# Patient Record
Sex: Female | Born: 1975 | Race: White | Hispanic: No | Marital: Married | Smoking: Never smoker
Health system: Southern US, Community
[De-identification: ages and names within clinical notes are randomized; demographics above are authoritative.]

## PROBLEM LIST (undated history)

## (undated) DIAGNOSIS — Z87898 Personal history of other specified conditions: Secondary | ICD-10-CM

## (undated) DIAGNOSIS — Z8614 Personal history of Methicillin resistant Staphylococcus aureus infection: Secondary | ICD-10-CM

## (undated) DIAGNOSIS — G4730 Sleep apnea, unspecified: Secondary | ICD-10-CM

## (undated) DIAGNOSIS — D759 Disease of blood and blood-forming organs, unspecified: Secondary | ICD-10-CM

## (undated) DIAGNOSIS — Z87820 Personal history of traumatic brain injury: Secondary | ICD-10-CM

## (undated) DIAGNOSIS — E669 Obesity, unspecified: Secondary | ICD-10-CM

## (undated) DIAGNOSIS — E162 Hypoglycemia, unspecified: Secondary | ICD-10-CM

## (undated) DIAGNOSIS — M1990 Unspecified osteoarthritis, unspecified site: Secondary | ICD-10-CM

## (undated) DIAGNOSIS — S92301K Fracture of unspecified metatarsal bone(s), right foot, subsequent encounter for fracture with nonunion: Secondary | ICD-10-CM

## (undated) DIAGNOSIS — H8109 Meniere's disease, unspecified ear: Secondary | ICD-10-CM

## (undated) DIAGNOSIS — K219 Gastro-esophageal reflux disease without esophagitis: Secondary | ICD-10-CM

## (undated) HISTORY — PX: WISDOM TOOTH EXTRACTION: SHX21

---

## 2006-06-06 DIAGNOSIS — Z8614 Personal history of Methicillin resistant Staphylococcus aureus infection: Secondary | ICD-10-CM

## 2006-06-06 HISTORY — DX: Personal history of Methicillin resistant Staphylococcus aureus infection: Z86.14

## 2010-08-05 DIAGNOSIS — IMO0002 Reserved for concepts with insufficient information to code with codable children: Secondary | ICD-10-CM | POA: Insufficient documentation

## 2010-08-05 DIAGNOSIS — R87613 High grade squamous intraepithelial lesion on cytologic smear of cervix (HGSIL): Secondary | ICD-10-CM | POA: Insufficient documentation

## 2010-10-18 ENCOUNTER — Encounter (INDEPENDENT_AMBULATORY_CARE_PROVIDER_SITE_OTHER): Payer: Self-pay | Admitting: Obstetrics & Gynecology

## 2010-10-18 ENCOUNTER — Other Ambulatory Visit: Payer: Self-pay | Admitting: Obstetrics & Gynecology

## 2010-10-18 ENCOUNTER — Other Ambulatory Visit (HOSPITAL_COMMUNITY)
Admission: RE | Admit: 2010-10-18 | Discharge: 2010-10-18 | Disposition: A | Discharge status: Home / Self Care | Payer: Self-pay | Source: Ambulatory Visit | Attending: Obstetrics & Gynecology | Admitting: Obstetrics & Gynecology

## 2010-10-18 DIAGNOSIS — N870 Mild cervical dysplasia: Secondary | ICD-10-CM | POA: Insufficient documentation

## 2010-10-18 DIAGNOSIS — R87613 High grade squamous intraepithelial lesion on cytologic smear of cervix (HGSIL): Secondary | ICD-10-CM

## 2010-10-18 DIAGNOSIS — N9489 Other specified conditions associated with female genital organs and menstrual cycle: Secondary | ICD-10-CM

## 2010-10-20 ENCOUNTER — Ambulatory Visit (HOSPITAL_COMMUNITY)
Admission: RE | Admit: 2010-10-20 | Discharge: 2010-10-20 | Disposition: A | Discharge status: Home / Self Care | Payer: Self-pay | Source: Ambulatory Visit | Attending: Obstetrics & Gynecology | Admitting: Obstetrics & Gynecology

## 2010-10-20 ENCOUNTER — Other Ambulatory Visit: Payer: Self-pay | Admitting: Obstetrics & Gynecology

## 2010-10-20 ENCOUNTER — Ambulatory Visit (HOSPITAL_COMMUNITY): Admission: RE | Admit: 2010-10-20 | Payer: Self-pay | Source: Ambulatory Visit

## 2010-10-20 DIAGNOSIS — N9489 Other specified conditions associated with female genital organs and menstrual cycle: Secondary | ICD-10-CM

## 2010-10-20 DIAGNOSIS — IMO0002 Reserved for concepts with insufficient information to code with codable children: Secondary | ICD-10-CM | POA: Insufficient documentation

## 2010-11-05 ENCOUNTER — Ambulatory Visit (INDEPENDENT_AMBULATORY_CARE_PROVIDER_SITE_OTHER): Payer: Self-pay | Admitting: Obstetrics & Gynecology

## 2010-11-05 DIAGNOSIS — R87613 High grade squamous intraepithelial lesion on cytologic smear of cervix (HGSIL): Secondary | ICD-10-CM

## 2010-11-15 NOTE — H&P (Signed)
  NAME:  Priscilla Jacobs, Priscilla Jacobs                ACCOUNT NO.:  [id number]  MEDICAL RECORD NO.:  [id number]           PATIENT TYPE:  A  LOCATION:  WOC                          FACILITY:  [facility]  PHYSICIAN:  Allie Bossier, MD        DATE OF BIRTH:  06/27/1975  DATE OF ADMISSION:  11/05/2010 DATE OF DISCHARGE:                             HISTORY & PHYSICAL   Ms. Priscilla Jacobs is a 35 year old married white G5, P4, A1 with 4 living children.  She was referred here for an abnormal Pap smear that showed high-grade dysplasia.  She gives a distant history of abnormal Pap smears and she had only what sounds like a colposcopy.  She is not a smoker.  Her colposcopy showed findings consistent with high-grade dysplasia.  Two random cervical biopsy showed low-grade dysplasia, however, an endocervical curettage showed a piece of detached high-grade dysplasia.  I have recommended a cone biopsy for this.  PAST MEDICAL HISTORY:  Significant only for obesity, high-grade dysplasia as mentioned above.  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  She has had no seizure since then and she tells me her neurologist told her the seizure was only due to her sugars and she has never been treated for seizures.  REVIEW OF SYSTEMS:  Her husband is status post vasectomy.  She has dyspareunia associated with orgasms for the last 2 years and an ultrasound that was done recently was normal.  She has been having hot flashes for the last year.  PAST SURGICAL HISTORY:  Wisdom tooth extraction.  FAMILY HISTORY:  Negative for breast, GYN, and colon malignancies.  SOCIAL HISTORY:  Negative for tobacco, alcohol, or drug use.  No latex allergies.  No drug allergies.  MEDICATIONS:  None.  PHYSICAL EXAMINATION:  GENERAL:  Well-nourished, well-hydrated pleasant white female. VITAL SIGNS:  Her height is 5 feet 0 inches, her weight is 200 pounds. Her blood pressure is 128/91, pulse is  normal. HEENT:  Normal. HEART:  Regular rate and rhythm. LUNGS:  Clear to auscultation bilaterally. ABDOMEN:  Benign, obese.  Colposcopy exam as above.  ASSESSMENT AND PLAN:  Positive endocervical curettage with high-grade dysplasia on Pap.  We will proceed with a cone biopsy.  I have explained risks of surgery as well as benefits and this will be scheduled at the earliest convenient time.     Allie Bossier, MD     MCD/MEDQ  D:  11/05/2010  T:  11/05/2010  Job:  [id number]  Electronically Signed by Nicholaus Bloom MD on 11/15/2010 09:47:31 AM

## 2010-12-03 ENCOUNTER — Encounter (HOSPITAL_COMMUNITY): Payer: Self-pay | Admitting: *Deleted

## 2010-12-29 ENCOUNTER — Encounter (HOSPITAL_COMMUNITY)
Admission: RE | Disposition: A | Discharge status: Home / Self Care | Payer: Self-pay | Source: Ambulatory Visit | Attending: Obstetrics & Gynecology

## 2010-12-29 ENCOUNTER — Ambulatory Visit (HOSPITAL_COMMUNITY): Payer: Self-pay | Admitting: Anesthesiology

## 2010-12-29 ENCOUNTER — Other Ambulatory Visit: Payer: Self-pay | Admitting: Obstetrics & Gynecology

## 2010-12-29 ENCOUNTER — Ambulatory Visit (HOSPITAL_COMMUNITY)
Admission: RE | Admit: 2010-12-29 | Discharge: 2010-12-29 | Disposition: A | Discharge status: Home / Self Care | Payer: Self-pay | Source: Ambulatory Visit | Attending: Obstetrics & Gynecology | Admitting: Obstetrics & Gynecology

## 2010-12-29 ENCOUNTER — Encounter (HOSPITAL_COMMUNITY): Payer: Self-pay | Admitting: Obstetrics & Gynecology

## 2010-12-29 ENCOUNTER — Encounter (HOSPITAL_COMMUNITY): Payer: Self-pay | Admitting: Anesthesiology

## 2010-12-29 DIAGNOSIS — R87613 High grade squamous intraepithelial lesion on cytologic smear of cervix (HGSIL): Secondary | ICD-10-CM

## 2010-12-29 HISTORY — DX: Obesity, unspecified: E66.9

## 2010-12-29 HISTORY — PX: CERVICAL CONIZATION W/BX: SHX1330

## 2010-12-29 LAB — CBC
HCT: 41.4 % (ref 36.0–46.0)
Hemoglobin: 13.9 g/dL (ref 12.0–15.0)
MCHC: 33.6 g/dL (ref 30.0–36.0)

## 2010-12-29 LAB — BASIC METABOLIC PANEL
CO2: 23 mEq/L (ref 19–32)
Calcium: 9.1 mg/dL (ref 8.4–10.5)
Glucose, Bld: 87 mg/dL (ref 70–99)
Potassium: 3.9 mEq/L (ref 3.5–5.1)
Sodium: 136 mEq/L (ref 135–145)

## 2010-12-29 SURGERY — CONE BIOPSY, CERVIX
Anesthesia: General

## 2010-12-29 MED ORDER — LIDOCAINE HCL (CARDIAC) 20 MG/ML IV SOLN
INTRAVENOUS | Status: AC
  Filled 2010-12-29: qty 5

## 2010-12-29 MED ORDER — LIDOCAINE HCL (CARDIAC) 20 MG/ML IV SOLN
INTRAVENOUS | Status: DC | PRN
  Administered 2010-12-29: 80 mg via INTRAVENOUS

## 2010-12-29 MED ORDER — ONDANSETRON HCL 4 MG/2ML IJ SOLN
INTRAMUSCULAR | Status: AC
  Filled 2010-12-29: qty 2

## 2010-12-29 MED ORDER — PANTOPRAZOLE SODIUM 40 MG PO TBEC: 40.0000 mg | DELAYED_RELEASE_TABLET | Freq: Once | ORAL | Status: DC | PRN

## 2010-12-29 MED ORDER — IBUPROFEN 600 MG PO TABS: 600.0000 mg | ORAL_TABLET | Freq: Four times a day (QID) | ORAL | Status: AC | PRN

## 2010-12-29 MED ORDER — LACTATED RINGERS IV SOLN
INTRAVENOUS | Status: DC
  Administered 2010-12-29 (×2): via INTRAVENOUS

## 2010-12-29 MED ORDER — KETOROLAC TROMETHAMINE 30 MG/ML IJ SOLN: 15.0000 mg | Freq: Once | INTRAMUSCULAR | Status: DC | PRN

## 2010-12-29 MED ORDER — SCOPOLAMINE 1 MG/3DAYS TD PT72: 1.0000 | MEDICATED_PATCH | Freq: Once | TRANSDERMAL | Status: DC | PRN

## 2010-12-29 MED ORDER — ONDANSETRON HCL 4 MG/2ML IJ SOLN
INTRAMUSCULAR | Status: DC | PRN
  Administered 2010-12-29: 4 mg via INTRAVENOUS

## 2010-12-29 MED ORDER — PROPOFOL 10 MG/ML IV EMUL
INTRAVENOUS | Status: DC | PRN
  Administered 2010-12-29: 200 mg via INTRAVENOUS

## 2010-12-29 MED ORDER — CITRIC ACID-SODIUM CITRATE 334-500 MG/5ML PO SOLN: 30.0000 mL | Freq: Once | ORAL | Status: DC | PRN

## 2010-12-29 MED ORDER — BUPIVACAINE HCL (PF) 0.5 % IJ SOLN
INTRAMUSCULAR | Status: DC | PRN
  Administered 2010-12-29: 10 mL

## 2010-12-29 MED ORDER — MIDAZOLAM HCL 2 MG/2ML IJ SOLN
INTRAMUSCULAR | Status: AC
  Filled 2010-12-29: qty 2

## 2010-12-29 MED ORDER — FENTANYL CITRATE 0.05 MG/ML IJ SOLN: 25.0000 ug | INTRAMUSCULAR | Status: DC | PRN

## 2010-12-29 MED ORDER — LUGOLS 5 % PO SOLN
ORAL | Status: DC | PRN
  Administered 2010-12-29: 0.1 mL

## 2010-12-29 MED ORDER — KETOROLAC TROMETHAMINE 30 MG/ML IJ SOLN
INTRAMUSCULAR | Status: AC
  Filled 2010-12-29: qty 1

## 2010-12-29 MED ORDER — DEXAMETHASONE SODIUM PHOSPHATE 10 MG/ML IJ SOLN
INTRAMUSCULAR | Status: AC
  Filled 2010-12-29: qty 1

## 2010-12-29 MED ORDER — FENTANYL CITRATE 0.05 MG/ML IJ SOLN
INTRAMUSCULAR | Status: AC
  Filled 2010-12-29: qty 2

## 2010-12-29 MED ORDER — FENTANYL CITRATE 0.05 MG/ML IJ SOLN
INTRAMUSCULAR | Status: DC | PRN
  Administered 2010-12-29 (×2): 50 ug via INTRAVENOUS

## 2010-12-29 MED ORDER — PROPOFOL 10 MG/ML IV EMUL
INTRAVENOUS | Status: AC
  Filled 2010-12-29: qty 20

## 2010-12-29 MED ORDER — DEXAMETHASONE SODIUM PHOSPHATE 4 MG/ML IJ SOLN
INTRAMUSCULAR | Status: DC | PRN
  Administered 2010-12-29: 4 mg via INTRAVENOUS

## 2010-12-29 MED ORDER — OXYCODONE-ACETAMINOPHEN 2.5-325 MG PO TABS: 1.0000 | ORAL_TABLET | ORAL | Status: AC | PRN

## 2010-12-29 MED ORDER — FAMOTIDINE 20 MG PO TABS: 20.0000 mg | ORAL_TABLET | Freq: Once | ORAL | Status: DC | PRN

## 2010-12-29 MED ORDER — MIDAZOLAM HCL 5 MG/5ML IJ SOLN
INTRAMUSCULAR | Status: DC | PRN
  Administered 2010-12-29: 2 mg via INTRAVENOUS

## 2010-12-29 SURGICAL SUPPLY — 27 items
BLADE SURG 11 STRL SS (BLADE) ×2 IMPLANT
CLOTH BEACON ORANGE TIMEOUT ST (SAFETY) ×2 IMPLANT
CONTAINER PREFILL 10% NBF 60ML (FORM) ×4 IMPLANT
COUNTER NEEDLE 1200 MAGNETIC (NEEDLE) ×2 IMPLANT
DILATOR CANAL MILEX (MISCELLANEOUS) ×1 IMPLANT
DRAPE UTILITY XL STRL (DRAPES) ×1 IMPLANT
ELECT REM PT RETURN 9FT ADLT (ELECTROSURGICAL) ×2
ELECTRODE REM PT RTRN 9FT ADLT (ELECTROSURGICAL) ×1 IMPLANT
GLOVE BIO SURGEON STRL SZ 6.5 (GLOVE) ×4 IMPLANT
GLOVE BIOGEL PI IND STRL 6 (GLOVE) ×1 IMPLANT
GLOVE BIOGEL PI INDICATOR 6 (GLOVE) ×1
GOWN PREVENTION PLUS LG XLONG (DISPOSABLE) ×4 IMPLANT
NDL SPNL 18GX3.5 QUINCKE PK (NEEDLE) ×1 IMPLANT
NEEDLE SPNL 18GX3.5 QUINCKE PK (NEEDLE) ×2 IMPLANT
PACK VAGINAL MINOR WOMEN LF (CUSTOM PROCEDURE TRAY) ×2 IMPLANT
PENCIL BUTTON HOLSTER BLD 10FT (ELECTRODE) ×2 IMPLANT
SCOPETTES 8  STERILE (MISCELLANEOUS) ×2
SCOPETTES 8 STERILE (MISCELLANEOUS) ×1 IMPLANT
SUT CHROMIC 0 CT 1 (SUTURE) IMPLANT
SUT VIC AB 0 CT1 27 (SUTURE) ×2
SUT VIC AB 0 CT1 27XBRD ANBCTR (SUTURE) IMPLANT
SUT VICRYL 0 UR6 27IN ABS (SUTURE) ×3 IMPLANT
SYR CONTROL 10ML LL (SYRINGE) ×2 IMPLANT
TOWEL OR 17X24 6PK STRL BLUE (TOWEL DISPOSABLE) ×4 IMPLANT
TUBING NON-CON 1/4 X 20 CONN (TUBING) ×2 IMPLANT
WATER STERILE IRR 1000ML POUR (IV SOLUTION) ×2 IMPLANT
YANKAUER SUCT BULB TIP NO VENT (SUCTIONS) ×2 IMPLANT

## 2010-12-29 NOTE — Transfer of Care (Signed)
Immediate Anesthesia Transfer of Care Note  Patient: Priscilla Jacobs  Procedure(s) Performed:  CONIZATION CERVIX WITH BIOPSY  Patient Location: PACU  Anesthesia Type: General  Level of Consciousness: sedated  Airway & Oxygen Therapy: Patient Spontanous Breathing and Patient connected to nasal cannula oxygen  Post-op Assessment: Report given to PACU RN and Post -op Vital signs reviewed and stable  Post vital signs: Reviewed and stable  Complications: No apparent anesthesia complications

## 2010-12-29 NOTE — Anesthesia Postprocedure Evaluation (Signed)
  Anesthesia Post-op Note  Patient: Priscilla Jacobs  Procedure(s) Performed:  CONIZATION CERVIX WITH BIOPSY  No anesthesia complications.  Level of consciousness: alert. Cardiopulmonary status stable.  No follow-up care or observation required.  [place] Severs L. Rodman Pickle, MD

## 2010-12-29 NOTE — Op Note (Signed)
Preoperative diagnosis: Positive endocervical curettage Postoperative diagnosis: Same Procedure: Cone biopsy and endocervical curettage Surgeon: Nicholaus Bloom, M.D. Anesthesia: LMA Complications: None Estimated blood loss: Minimal Specimens: Cone biopsy and endocervical curettage Detailed procedure and findings: The risks, benefits, and alternatives of surgery were explained, understood, accepted. Consents were signed. Questions were answered. She was taken to the operating room, and and general anesthesia was applied without complication. She was placed in the dorsal lithotomy position, and her vagina was prepped and draped in the usual sterile fashion. Her bladder was emptied with a Robinson catheter for approximately 400 mL of urine. A weighted speculum was placed posteriorly, and a Deaver was placed anteriorly. Her cervix was covered with Lugol solution. The cervix essentially did not stain at all. Using a cervical blade, a wedge-shaped portion of tissue was removed from the cervix. An endocervical curettage was done. The cone bed was cauterized with the Bovie. Hemostasis was assured. A pursestring suture of 0 Vicryl was used to close the cervix. Excellent hemostasis was noted. The instrument, sponge, and needle counts were correct. She was extubated and taken to the recovery room in stable condition. She tolerated the procedure well.

## 2010-12-29 NOTE — Anesthesia Procedure Notes (Addendum)
Procedure Name: LMA Insertion Performed by: Carlyle Lipa Pre-anesthesia Checklist: Patient identified Patient Re-evaluated:Patient Re-evaluated prior to inductionOxygen Delivery Method: Circle System Utilized Preoxygenation: Pre-oxygenation with 100% oxygen Intubation Type: IV induction Ventilation: Mask ventilation without difficulty LMA: LMA with gastric port inserted LMA Size: 4.0 Grade View: Grade III Dental Injury: Teeth and Oropharynx as per pre-operative assessment

## 2010-12-29 NOTE — Anesthesia Preprocedure Evaluation (Signed)
Anesthesia Evaluation  Name, MR# and DOB Patient awake  General Assessment Comment  Reviewed: Allergy & Precautions, H&P , Patient's Chart, lab work & pertinent test results and reviewed documented beta blocker date and time   History of Anesthesia Complications Negative for: history of anesthetic complications  Airway Mallampati: II TM Distance: >3 FB     Dental  (+) Teeth Intact   Pulmonaryneg pulmonary ROS    clear to auscultation    Cardiovascular Exercise Tolerance: Poor regular Normal   Neuro/Psych (+) {AN ROS/MED HX NEURO HEADACHES Seizures - (once related to hypoglycemia),   GI/Hepatic/Renal negative GI ROS, negative Liver ROS, and negative Renal ROS (+)       Endo/Other  Hypoglycemia - has been evaluated by [facility] and told pancreas produces too much insulin.  Has had one seizure due to hypoglycemia and multiple episodes of syncope related to hypoglycemia. Abdominal   Musculoskeletal  Hematology negative hematology ROS (+)   Peds  Reproductive/Obstetrics negative OB ROS   Anesthesia Other Findings             Anesthesia Physical Anesthesia Plan  ASA: III  Anesthesia Plan: General   Post-op Pain Management:    Induction:   Airway Management Planned: LMA  Additional Equipment:   Intra-op Plan:   Post-operative Plan:   Informed Consent: I have reviewed the patients History and Physical, chart, labs and discussed the procedure including the risks, benefits and alternatives for the proposed anesthesia with the patient or authorized representative who has indicated his/her understanding and acceptance.     Plan Discussed with:   Anesthesia Plan Comments:         Anesthesia Quick Evaluation

## 2010-12-29 NOTE — H&P (Signed)
  Priscilla Jacobs is a 35 year old married white gravida 5 para 4 abortus 1 with 4 living children. She was referred to the GYN clinic for an abnormal Pap smear that showed high-grade dysplasia. She gives a distant history of abnormal Pap smears, and she had what sounds like a colposcopy in the past. She is a nonsmoker. Her colposcopy here showed findings consistent with high-grade dysplasia. 2 random cervical biopsy showed low-grade dysplasia; however, an endocervical curettage 30 piece of detached high-grade dysplasia. I recommended cone biopsy for this.  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 She's been having hot flashes for the last year.  Past surgical history: Wisdom teeth extraction  Family history: Negative for breast, GYN, colon malignancies  Social history: Negative for tobacco, alcohol, or drug use. She has no latex or drug allergies.  Medications: None  Physical exam Gen.: Well-nourished, well-hydrated, pleasant white female Vital signs stable her height is 5 feet 0 inches. Her weight is 200 pounds. Her blood pressure is 128/91. An her pulse is normal. HEENT: Normal Lungs: Clear to auscultation bilaterally Abdomen: Benign, obese Assessment and plan: High-grade dysplasia on endocervical curettage. I will do an cone biopsy with ECC.

## 2011-01-13 ENCOUNTER — Telehealth: Payer: Self-pay | Admitting: Obstetrics and Gynecology

## 2011-01-13 NOTE — Telephone Encounter (Signed)
Returned call. She wanted to know result of cone biopsy performed in July. Advised her of results. She wanted to still see Dr. Marice Potter for c/o pain during intercourse and further discussion of results. Transferred patient to front desk and appointment made.

## 2011-01-20 ENCOUNTER — Encounter (HOSPITAL_COMMUNITY): Payer: Self-pay | Admitting: Obstetrics & Gynecology

## 2011-02-23 DIAGNOSIS — IMO0002 Reserved for concepts with insufficient information to code with codable children: Secondary | ICD-10-CM

## 2011-02-25 ENCOUNTER — Ambulatory Visit: Payer: Self-pay | Admitting: Obstetrics & Gynecology

## 2011-03-23 ENCOUNTER — Telehealth: Payer: Self-pay | Admitting: *Deleted

## 2011-03-23 NOTE — Telephone Encounter (Signed)
Pt left message stating that she could not keep appt tomorrow and has re-scheduled. She wants to know if it is necessary to have appt for colpo results of if she can receive them by phone.

## 2011-03-24 ENCOUNTER — Ambulatory Visit: Payer: Self-pay | Admitting: Obstetrics & Gynecology

## 2011-03-24 NOTE — Telephone Encounter (Signed)
Called pt was unable to reach pt but left message that we are returning her call and to give [place] a return call to the clinics.  Pt needs to be informed her colpo results.  Per Rosalita [place],CNM pt colpo results are normal and needs to make an appt for 6 month rpt pap.

## 2011-03-29 NOTE — Telephone Encounter (Signed)
Telephoned pt left message to return call to clinic about results of colpo.

## 2011-03-30 NOTE — Telephone Encounter (Signed)
Patient notified of results, She cancelled her appt for 11/8 and will make a 6 month pap appointment.

## 2011-04-14 ENCOUNTER — Ambulatory Visit: Payer: Self-pay | Admitting: Obstetrics & Gynecology

## 2013-10-31 ENCOUNTER — Other Ambulatory Visit (HOSPITAL_COMMUNITY): Payer: Self-pay | Admitting: *Deleted

## 2013-10-31 DIAGNOSIS — Z1231 Encounter for screening mammogram for malignant neoplasm of breast: Secondary | ICD-10-CM

## 2013-11-04 ENCOUNTER — Ambulatory Visit (HOSPITAL_COMMUNITY): Admission: RE | Admit: 2013-11-04 | Payer: Self-pay | Source: Ambulatory Visit

## 2014-03-19 ENCOUNTER — Ambulatory Visit (INDEPENDENT_AMBULATORY_CARE_PROVIDER_SITE_OTHER): Payer: Self-pay | Admitting: Neurology

## 2014-03-19 ENCOUNTER — Encounter: Payer: Self-pay | Admitting: Neurology

## 2014-03-19 VITALS — BP 124/80 | HR 70 | Ht 60.0 in | Wt 217.9 lb

## 2014-03-19 DIAGNOSIS — R569 Unspecified convulsions: Secondary | ICD-10-CM

## 2014-03-19 DIAGNOSIS — E162 Hypoglycemia, unspecified: Secondary | ICD-10-CM

## 2014-03-19 DIAGNOSIS — R55 Syncope and collapse: Secondary | ICD-10-CM

## 2014-03-19 DIAGNOSIS — R42 Dizziness and giddiness: Secondary | ICD-10-CM

## 2014-03-19 NOTE — Patient Instructions (Signed)
1. Schedule routine EEG 2. It is very important to follow-up with a primary care physician to address sugar and thyroid issues

## 2014-03-19 NOTE — Progress Notes (Signed)
NEUROLOGY CONSULTATION NOTE  Priscilla MaduroSusan Jacobs MRN: [id number] DOB: [birth year]  Referring provider: Jacquelin HawkingShannon McElroy, PA-C Primary care provider: Jacquelin HawkingShannon McElroy, PA-C  Reason for consult:  Possible seizure  Thank you for your kind referral of Priscilla Jacobs for consultation of the above symptoms. Although her history is well known to you, please allow me to reiterate it for the purpose of our medical record. Records and images were personally reviewed where available.  HISTORY OF PRESENT ILLNESS: This is a 38 year old right-handed woman with a history of hypoglycemia and Meniere's disease, presenting for dizziness and episodes of loss of consciousness.  She reports that she has not felt good in 4 years.  She "stays dizzy a lot."  Dizziness is described as feeling lightheaded, particularly if she walks fast, and spinning if she turns her head a certain way. She takes prn meclizine with some effect but makes her drowsy.  She had been seeing ENT with a diagnosis of Meniere's disease but was lost to follow-up due to insurance issues.  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 Her heart was racing and she got upset, bystanders heard her scream and fall to the floor with a seizure, glucose at that time was low at 52. She reports passing out in January and February, coming to pretty quickly. In mid-August, she was feeling unwell but went shopping and played golf, then her vision went black, then she passed out. She was told by her husband that she was holding on to the golf cart, screaming, foaming at the mouth. She bit her tongue. [facility] with note of urinary incontinence. She tells me her glucose level was 76 at that time. Records from [facility] unavailable for review, per patient she had a head CT which was reported as normal.    She has various symptoms, including palpitations, she feels hot constantly. She has headaches a couple of times a  week, with pressure in the frontal regions, lasting 30 minutes to half a day. It feels like her brain is floating around on water with tingling in her scalp.  Pulling her hair up makes it worse. She does not take any medications for this, no associated nausea, vomiting, photo/phonophobia with the headaches. She however feels nauseated frequently, and drinks ginger ale a lot. She denies any diplopia, dysarthria, dysphagia, neck/back pain, bowel/bladder dysfunction. She has been to endocrinologists in the past and was told her "pancreas produces too much insulin and I have to lose weight."  There is no family history of seizures. Her sister had a defibrillator placed at age 40.  She had a normal birth and early development.  There is no history of febrile convulsions, CNS infections such as meningitis/encephalitis, significant traumatic brain injury, neurosurgical procedures.  Laboratory Data 01/16/2014 CBC, CMP, TSH (3.607), HbA1c (5.9) normal.  PAST MEDICAL HISTORY: Past Medical History  Diagnosis Date  . Headache(784.0)   . Depression     post partum  . Obesity   . Severe cervical dysplasia, histologically confirmed   . Abnormal Pap smear 08/05/10    HGSIL    PAST SURGICAL HISTORY: Past Surgical History  Procedure Laterality Date  . Wisdom tooth extraction    . Cervical conization w/bx  12/29/2010    Procedure: CONIZATION CERVIX WITH BIOPSY;  Surgeon: Hollie SalkMyra C. Marice Potterove, MD;  Location: [facility];  Service: Gynecology;  Laterality: N/A;    MEDICATIONS: No current outpatient  prescriptions on file prior to visit.   No current facility-administered medications on file prior to visit.    ALLERGIES: No Known Allergies  FAMILY HISTORY: History reviewed. No pertinent family history.  SOCIAL HISTORY: History   Social History  . Marital Status: Married    Spouse Name: N/A    Number of Children: N/A  . Years of Education: N/A   Occupational History  . Not on file.   Social History Main  Topics  . Smoking status: Never Smoker   . Smokeless tobacco: Not on file  . Alcohol Use: No  . Drug Use: No  . Sexual Activity: Yes    Birth Control/ Protection: Surgical, Other-see comments     Comment: husband had vasectomy   Other Topics Concern  . Not on file   Social History Narrative  . No narrative on file    REVIEW OF SYSTEMS: Constitutional: No fevers, chills, or sweats, no generalized fatigue, change in appetite Eyes: No visual changes, double vision, eye pain Ear, nose and throat: No hearing loss, ear pain, nasal congestion, sore throat Cardiovascular: No chest pain, + palpitations Respiratory:  No shortness of breath at rest or with exertion, wheezes GastrointestinaI: + nausea, no vomiting, diarrhea, abdominal pain, fecal incontinence Genitourinary:  No dysuria, urinary retention or frequency Musculoskeletal:  No neck pain, back pain Integumentary: No rash, pruritus, skin lesions Neurological: as above Psychiatric: No depression, insomnia, anxiety Endocrine: No palpitations, fatigue, diaphoresis, mood swings, change in appetite, change in weight, increased thirst Hematologic/Lymphatic:  No anemia, purpura, petechiae. Allergic/Immunologic: no itchy/runny eyes, nasal congestion, recent allergic reactions, rashes  PHYSICAL EXAM: Filed Vitals:   03/19/14 0854  BP: 124/80  Pulse: 70   General: No acute distress Head:  Normocephalic/atraumatic Eyes: Fundoscopic exam shows bilateral sharp discs, no vessel changes, exudates, or hemorrhages Neck: supple, no paraspinal tenderness, full range of motion Back: No paraspinal tenderness Heart: regular rate and rhythm Lungs: Clear to auscultation bilaterally. Vascular: No carotid bruits. Skin/Extremities: No rash, no edema Neurological Exam: Mental status: alert and oriented to person, place, and time, no dysarthria or aphasia, Fund of knowledge is appropriate.  Recent and remote memory are intact.  Attention and  concentration are normal.    Able to name objects and repeat phrases. Cranial nerves: CN I: not tested CN II: pupils equal, round and reactive to light, visual fields intact, fundi unremarkable. CN III, IV, VI:  full range of motion, no nystagmus, no ptosis CN V: facial sensation intact CN VII: upper and lower face symmetric CN VIII: hearing intact to finger rub CN IX, X: gag intact, uvula midline CN XI: sternocleidomastoid and trapezius muscles intact CN XII: tongue midline Bulk & Tone: normal, no fasciculations. Motor: 5/5 throughout with no pronator drift. Sensation: intact to light touch, cold, pin, vibration and joint position sense.  No extinction to double simultaneous stimulation.  Romberg test negative Deep Tendon Reflexes: +2 throughout, no ankle clonus Plantar responses: downgoing bilaterally Cerebellar: no incoordination on finger to nose, heel to shin. No dysdiadochokinesia Gait: narrow-based and steady, able to tandem walk adequately. Tremor: none  IMPRESSION: This is a 38 year old right-handed woman with a history of hypoglycemia and Meniere's disease, presenting for episodes suggestive of presyncope/syncope. During the most recent episode of syncope, she had report of a convulsion, suggestive of convulsive syncope, less likely epileptic seizure. She was also noted to have low blood sugar during these episodes, hypoglycemic seizure is also a possibility.  Her neurological exam is normal. Head  CT reported as normal, records will be requested for review. Routine EEG will be ordered.  We discussed her symptoms, which are non-specific and more suggestive of a systemic/metabolic condition rather than primary neurologic. Continue care with PCP, she may benefit from Cardiology evaluation of syncope with family history of early heart disease. She knows to call our office for any change in symptoms.  [place] driving laws were discussed with the patient, and she knows to stop driving after any  episode of loss of consciousness, until 6 months event-free.   Thank you for allowing me to participate in the care of this patient. Please do not hesitate to call for any questions or concerns.   Patrcia Dolly, M.D.

## 2014-03-20 ENCOUNTER — Encounter: Payer: Self-pay | Admitting: Neurology

## 2014-03-20 ENCOUNTER — Telehealth: Payer: Self-pay | Admitting: Family Medicine

## 2014-03-20 DIAGNOSIS — R55 Syncope and collapse: Secondary | ICD-10-CM | POA: Insufficient documentation

## 2014-03-20 DIAGNOSIS — R42 Dizziness and giddiness: Secondary | ICD-10-CM | POA: Insufficient documentation

## 2014-03-20 DIAGNOSIS — R569 Unspecified convulsions: Secondary | ICD-10-CM | POA: Insufficient documentation

## 2014-03-20 DIAGNOSIS — H8109 Meniere's disease, unspecified ear: Secondary | ICD-10-CM | POA: Insufficient documentation

## 2014-03-20 NOTE — Telephone Encounter (Signed)
Called pt to ask her to do a record request from [facility] of her records & CD of CT Head. Since pt lives in [place] thought it would be easier for to go there to fill out record request to have records forwarded to Dr. Karel JarvisAquino instead of her coming to our office to sign release. She states she would go by the hospital tomorrow morning and have that done.

## 2014-03-25 ENCOUNTER — Ambulatory Visit (INDEPENDENT_AMBULATORY_CARE_PROVIDER_SITE_OTHER): Payer: Self-pay | Admitting: Neurology

## 2014-03-25 DIAGNOSIS — R42 Dizziness and giddiness: Secondary | ICD-10-CM

## 2014-03-25 DIAGNOSIS — R55 Syncope and collapse: Secondary | ICD-10-CM

## 2014-03-25 DIAGNOSIS — R569 Unspecified convulsions: Secondary | ICD-10-CM

## 2014-03-26 ENCOUNTER — Telehealth: Payer: Self-pay | Admitting: Neurology

## 2014-03-26 NOTE — Telephone Encounter (Signed)
Faxed over office notes for DOS 03-19-14 to Soledad Ger[facility]Kim P [facility] fax number [phone]

## 2014-03-26 NOTE — Procedures (Signed)
ELECTROENCEPHALOGRAM REPORT  Date of Study: 03/25/2014  Patient's Name: Priscilla MaduroSusan Jacobs MRN: [id number] Date of Birth: 11-Feb-1976  Referring Provider: Dr. Patrcia DollyKaren Aquino  Clinical History: This is a 38 year old woman with a history of hypoglycemia and Meniere's disease with presyncopal episodes and recent episode of loss of consciousness with report of convulsion.  Medications: none  Technical Summary: A multichannel digital EEG recording measured by the international 10-20 system with electrodes applied with paste and impedances below 5000 ohms performed in our laboratory with EKG monitoring in an awake and asleep patient.  Hyperventilation and photic stimulation were performed.  The digital EEG was referentially recorded, reformatted, and digitally filtered in a variety of bipolar and referential montages for optimal display.    Description: The patient is awake and asleep during the recording.  During maximal wakefulness, there is a symmetric, medium voltage 11 Hz posterior dominant rhythm that attenuates with eye opening.  The record is symmetric.  During drowsiness and sleep, there is an increase in theta slowing of the background, with shifting asymmetry seen over the bilateral temporal regions, at times sharply contoured without clear epileptogenic potential.  Vertex waves and symmetric sleep spindles were seen.  Hyperventilation and photic stimulation did not elicit any abnormalities.  There were no epileptiform discharges or electrographic seizures seen.    EKG lead was unremarkable.  Impression: This awake and aslee EEG is within normal limits.  Clinical Correlation: A normal EEG does not exclude a clinical diagnosis of epilepsy. Clinical correlation is advised.   Patrcia DollyKaren Aquino, M.D.

## 2014-04-07 ENCOUNTER — Encounter: Payer: Self-pay | Admitting: Neurology

## 2016-02-05 DIAGNOSIS — Z87820 Personal history of traumatic brain injury: Secondary | ICD-10-CM

## 2016-02-05 HISTORY — DX: Personal history of traumatic brain injury: Z87.820

## 2016-02-13 ENCOUNTER — Emergency Department (HOSPITAL_COMMUNITY): Payer: Medicaid Other

## 2016-02-13 ENCOUNTER — Encounter (HOSPITAL_COMMUNITY): Admission: EM | Disposition: A | Discharge status: Rehabilitation Facility | Payer: Self-pay | Source: Home / Self Care

## 2016-02-13 ENCOUNTER — Observation Stay (HOSPITAL_COMMUNITY): Payer: Medicaid Other | Admitting: Anesthesiology

## 2016-02-13 ENCOUNTER — Encounter (HOSPITAL_COMMUNITY): Payer: Self-pay | Admitting: Emergency Medicine

## 2016-02-13 ENCOUNTER — Observation Stay (HOSPITAL_COMMUNITY): Payer: Medicaid Other

## 2016-02-13 ENCOUNTER — Inpatient Hospital Stay (HOSPITAL_COMMUNITY)
Admission: EM | Admit: 2016-02-13 | Discharge: 2016-02-22 | DRG: 464 | Disposition: A | Discharge status: Rehabilitation Facility | Payer: Medicaid Other | Attending: Surgery | Admitting: Surgery

## 2016-02-13 DIAGNOSIS — Z419 Encounter for procedure for purposes other than remedying health state, unspecified: Secondary | ICD-10-CM

## 2016-02-13 DIAGNOSIS — S92313B Displaced fracture of first metatarsal bone, unspecified foot, initial encounter for open fracture: Secondary | ICD-10-CM | POA: Diagnosis present

## 2016-02-13 DIAGNOSIS — S92331B Displaced fracture of third metatarsal bone, right foot, initial encounter for open fracture: Secondary | ICD-10-CM | POA: Diagnosis present

## 2016-02-13 DIAGNOSIS — S82452A Displaced comminuted fracture of shaft of left fibula, initial encounter for closed fracture: Secondary | ICD-10-CM | POA: Diagnosis present

## 2016-02-13 DIAGNOSIS — S92341B Displaced fracture of fourth metatarsal bone, right foot, initial encounter for open fracture: Secondary | ICD-10-CM | POA: Diagnosis present

## 2016-02-13 DIAGNOSIS — Z23 Encounter for immunization: Secondary | ICD-10-CM

## 2016-02-13 DIAGNOSIS — S92301A Fracture of unspecified metatarsal bone(s), right foot, initial encounter for closed fracture: Secondary | ICD-10-CM

## 2016-02-13 DIAGNOSIS — S82252A Displaced comminuted fracture of shaft of left tibia, initial encounter for closed fracture: Secondary | ICD-10-CM | POA: Diagnosis present

## 2016-02-13 DIAGNOSIS — Z6841 Body Mass Index (BMI) 40.0 and over, adult: Secondary | ICD-10-CM

## 2016-02-13 DIAGNOSIS — S83419A Sprain of medial collateral ligament of unspecified knee, initial encounter: Secondary | ICD-10-CM | POA: Diagnosis present

## 2016-02-13 DIAGNOSIS — S92901A Unspecified fracture of right foot, initial encounter for closed fracture: Secondary | ICD-10-CM | POA: Diagnosis present

## 2016-02-13 DIAGNOSIS — S93621A Sprain of tarsometatarsal ligament of right foot, initial encounter: Secondary | ICD-10-CM | POA: Diagnosis present

## 2016-02-13 DIAGNOSIS — G8918 Other acute postprocedural pain: Secondary | ICD-10-CM

## 2016-02-13 DIAGNOSIS — S36892A Contusion of other intra-abdominal organs, initial encounter: Secondary | ICD-10-CM | POA: Diagnosis present

## 2016-02-13 DIAGNOSIS — S82402A Unspecified fracture of shaft of left fibula, initial encounter for closed fracture: Secondary | ICD-10-CM

## 2016-02-13 DIAGNOSIS — D62 Acute posthemorrhagic anemia: Secondary | ICD-10-CM | POA: Diagnosis not present

## 2016-02-13 DIAGNOSIS — K5903 Drug induced constipation: Secondary | ICD-10-CM

## 2016-02-13 DIAGNOSIS — R7303 Prediabetes: Secondary | ICD-10-CM | POA: Diagnosis present

## 2016-02-13 DIAGNOSIS — S92312B Displaced fracture of first metatarsal bone, left foot, initial encounter for open fracture: Secondary | ICD-10-CM | POA: Diagnosis present

## 2016-02-13 DIAGNOSIS — S92412B Displaced fracture of proximal phalanx of left great toe, initial encounter for open fracture: Secondary | ICD-10-CM

## 2016-02-13 DIAGNOSIS — H8109 Meniere's disease, unspecified ear: Secondary | ICD-10-CM

## 2016-02-13 DIAGNOSIS — S92311B Displaced fracture of first metatarsal bone, right foot, initial encounter for open fracture: Secondary | ICD-10-CM | POA: Diagnosis present

## 2016-02-13 DIAGNOSIS — S92321B Displaced fracture of second metatarsal bone, right foot, initial encounter for open fracture: Secondary | ICD-10-CM | POA: Diagnosis present

## 2016-02-13 DIAGNOSIS — T1490XA Injury, unspecified, initial encounter: Secondary | ICD-10-CM

## 2016-02-13 DIAGNOSIS — M25551 Pain in right hip: Secondary | ICD-10-CM | POA: Diagnosis present

## 2016-02-13 DIAGNOSIS — S82202A Unspecified fracture of shaft of left tibia, initial encounter for closed fracture: Secondary | ICD-10-CM | POA: Diagnosis present

## 2016-02-13 DIAGNOSIS — S82302A Unspecified fracture of lower end of left tibia, initial encounter for closed fracture: Secondary | ICD-10-CM

## 2016-02-13 HISTORY — PX: ANKLE CLOSED REDUCTION: SHX880

## 2016-02-13 HISTORY — PX: I & D EXTREMITY: SHX5045

## 2016-02-13 LAB — PREPARE FRESH FROZEN PLASMA
UNIT DIVISION: 0
Unit division: 0

## 2016-02-13 LAB — TYPE AND SCREEN
ABO/RH(D): B NEG
ANTIBODY SCREEN: NEGATIVE
UNIT DIVISION: 0
Unit division: 0

## 2016-02-13 LAB — URINALYSIS, ROUTINE W REFLEX MICROSCOPIC
BILIRUBIN URINE: NEGATIVE
GLUCOSE, UA: NEGATIVE mg/dL
Ketones, ur: NEGATIVE mg/dL
Leukocytes, UA: NEGATIVE
Nitrite: NEGATIVE
PROTEIN: NEGATIVE mg/dL
Specific Gravity, Urine: 1.031 — ABNORMAL HIGH (ref 1.005–1.030)
pH: 6 (ref 5.0–8.0)

## 2016-02-13 LAB — CBC
HCT: 42.4 % (ref 36.0–46.0)
Hemoglobin: 13.7 g/dL (ref 12.0–15.0)
MCH: 28.7 pg (ref 26.0–34.0)
MCHC: 32.3 g/dL (ref 30.0–36.0)
MCV: 88.9 fL (ref 78.0–100.0)
Platelets: 346 K/uL (ref 150–400)
RBC: 4.77 MIL/uL (ref 3.87–5.11)
RDW: 13.4 % (ref 11.5–15.5)
WBC: 15.9 K/uL — ABNORMAL HIGH (ref 4.0–10.5)

## 2016-02-13 LAB — COMPREHENSIVE METABOLIC PANEL WITH GFR
ALT: 77 U/L — ABNORMAL HIGH (ref 14–54)
AST: 98 U/L — ABNORMAL HIGH (ref 15–41)
Albumin: 3.7 g/dL (ref 3.5–5.0)
Alkaline Phosphatase: 73 U/L (ref 38–126)
Anion gap: 14 (ref 5–15)
BUN: 12 mg/dL (ref 6–20)
CO2: 17 mmol/L — ABNORMAL LOW (ref 22–32)
Calcium: 8.8 mg/dL — ABNORMAL LOW (ref 8.9–10.3)
Chloride: 109 mmol/L (ref 101–111)
Creatinine, Ser: 0.91 mg/dL (ref 0.44–1.00)
GFR calc Af Amer: >60 mL/min
GFR calc non Af Amer: >60 mL/min
Glucose, Bld: 100 mg/dL — ABNORMAL HIGH (ref 65–99)
Potassium: 3.6 mmol/L (ref 3.5–5.1)
Sodium: 140 mmol/L (ref 135–145)
Total Bilirubin: 0.4 mg/dL (ref 0.3–1.2)
Total Protein: 6.9 g/dL (ref 6.5–8.1)

## 2016-02-13 LAB — URINE MICROSCOPIC-ADD ON

## 2016-02-13 LAB — ETHANOL: Alcohol, Ethyl (B): <5 mg/dL

## 2016-02-13 LAB — I-STAT CG4 LACTIC ACID, ED: LACTIC ACID, VENOUS: 5.47 mmol/L — AB (ref 0.5–1.9)

## 2016-02-13 LAB — I-STAT CHEM 8, ED
BUN: 14 mg/dL (ref 6–20)
CALCIUM ION: 1 mmol/L — AB (ref 1.15–1.40)
CHLORIDE: 108 mmol/L (ref 101–111)
Creatinine, Ser: 0.9 mg/dL (ref 0.44–1.00)
Glucose, Bld: 99 mg/dL (ref 65–99)
HCT: 42 % (ref 36.0–46.0)
Hemoglobin: 14.3 g/dL (ref 12.0–15.0)
POTASSIUM: 3.5 mmol/L (ref 3.5–5.1)
SODIUM: 140 mmol/L (ref 135–145)
TCO2: 20 mmol/L (ref 0–100)

## 2016-02-13 LAB — PROTIME-INR
INR: 0.98
Prothrombin Time: 13 s (ref 11.4–15.2)

## 2016-02-13 LAB — ABO/RH: ABO/RH(D): B NEG

## 2016-02-13 SURGERY — CLOSED REDUCTION, ANKLE
Anesthesia: General | Site: Leg Lower | Laterality: Right

## 2016-02-13 MED ORDER — METOCLOPRAMIDE HCL 5 MG PO TABS: 5.0000 mg | ORAL_TABLET | Freq: Three times a day (TID) | ORAL | Status: DC | PRN

## 2016-02-13 MED ORDER — PROPOFOL 10 MG/ML IV BOLUS
INTRAVENOUS | Status: AC
  Filled 2016-02-13: qty 20

## 2016-02-13 MED ORDER — HYDROCODONE-ACETAMINOPHEN 5-325 MG PO TABS
1.0000 | ORAL_TABLET | ORAL | Status: DC | PRN
  Administered 2016-02-13 – 2016-02-14 (×3): 2 via ORAL
  Filled 2016-02-13 (×3): qty 2

## 2016-02-13 MED ORDER — CEFAZOLIN SODIUM-DEXTROSE 2-4 GM/100ML-% IV SOLN
INTRAVENOUS | Status: AC
  Filled 2016-02-13: qty 100

## 2016-02-13 MED ORDER — ONDANSETRON HCL 4 MG/2ML IJ SOLN
INTRAMUSCULAR | Status: DC | PRN
  Administered 2016-02-13: 4 mg via INTRAVENOUS

## 2016-02-13 MED ORDER — HYDROMORPHONE HCL 1 MG/ML IJ SOLN
1.0000 mg | Freq: Once | INTRAMUSCULAR | Status: AC
  Administered 2016-02-13: 1 mg via INTRAVENOUS

## 2016-02-13 MED ORDER — PROPOFOL 10 MG/ML IV BOLUS
INTRAVENOUS | Status: DC | PRN
  Administered 2016-02-13: 200 mg via INTRAVENOUS

## 2016-02-13 MED ORDER — ONDANSETRON HCL 4 MG PO TABS: 4.0000 mg | ORAL_TABLET | Freq: Four times a day (QID) | ORAL | Status: DC | PRN

## 2016-02-13 MED ORDER — LACTATED RINGERS IV SOLN
INTRAVENOUS | Status: DC
  Administered 2016-02-13: 17:00:00 via INTRAVENOUS

## 2016-02-13 MED ORDER — TETANUS-DIPHTH-ACELL PERTUSSIS 5-2.5-18.5 LF-MCG/0.5 IM SUSP
0.5000 mL | Freq: Once | INTRAMUSCULAR | Status: AC
  Administered 2016-02-13: 0.5 mL via INTRAMUSCULAR

## 2016-02-13 MED ORDER — TETANUS-DIPHTHERIA TOXOIDS TD 5-2 LFU IM INJ: 0.5000 mL | INJECTION | Freq: Once | INTRAMUSCULAR | Status: DC

## 2016-02-13 MED ORDER — FENTANYL CITRATE (PF) 100 MCG/2ML IJ SOLN
INTRAMUSCULAR | Status: AC
  Filled 2016-02-13: qty 4

## 2016-02-13 MED ORDER — CEFAZOLIN IN D5W 1 GM/50ML IV SOLN
1.0000 g | Freq: Four times a day (QID) | INTRAVENOUS | Status: DC
  Filled 2016-02-13 (×2): qty 50

## 2016-02-13 MED ORDER — DOCUSATE SODIUM 100 MG PO CAPS
100.0000 mg | ORAL_CAPSULE | Freq: Two times a day (BID) | ORAL | Status: DC
Start: 2016-02-13 — End: 2016-02-16
  Administered 2016-02-15 (×2): 100 mg via ORAL
  Filled 2016-02-13 (×5): qty 1

## 2016-02-13 MED ORDER — HYDROMORPHONE HCL 1 MG/ML IJ SOLN
0.5000 mg | INTRAMUSCULAR | Status: DC | PRN
  Administered 2016-02-13: 1 mg via INTRAVENOUS
  Administered 2016-02-14 (×3): 2 mg via INTRAVENOUS
  Filled 2016-02-13: qty 2
  Filled 2016-02-13: qty 1
  Filled 2016-02-13 (×2): qty 2

## 2016-02-13 MED ORDER — CEFAZOLIN SODIUM-DEXTROSE 2-4 GM/100ML-% IV SOLN
2.0000 g | Freq: Three times a day (TID) | INTRAVENOUS | Status: DC
  Administered 2016-02-13: 2 g via INTRAVENOUS
  Administered 2016-02-13: 1 g via INTRAVENOUS
  Administered 2016-02-14 – 2016-02-15 (×4): 2 g via INTRAVENOUS
  Filled 2016-02-13 (×7): qty 100

## 2016-02-13 MED ORDER — ENOXAPARIN SODIUM 40 MG/0.4ML ~~LOC~~ SOLN
40.0000 mg | SUBCUTANEOUS | Status: DC
  Administered 2016-02-14 – 2016-02-15 (×2): 40 mg via SUBCUTANEOUS
  Filled 2016-02-13 (×2): qty 0.4

## 2016-02-13 MED ORDER — HYDROMORPHONE HCL 1 MG/ML IJ SOLN
INTRAMUSCULAR | Status: AC
  Filled 2016-02-13: qty 1

## 2016-02-13 MED ORDER — 0.9 % SODIUM CHLORIDE (POUR BTL) OPTIME
TOPICAL | Status: DC | PRN
  Administered 2016-02-13: 1000 mL

## 2016-02-13 MED ORDER — FENTANYL CITRATE (PF) 250 MCG/5ML IJ SOLN
INTRAMUSCULAR | Status: DC | PRN
  Administered 2016-02-13 (×4): 50 ug via INTRAVENOUS

## 2016-02-13 MED ORDER — MIDAZOLAM HCL 2 MG/2ML IJ SOLN
INTRAMUSCULAR | Status: AC
  Filled 2016-02-13: qty 2

## 2016-02-13 MED ORDER — CEFAZOLIN SODIUM-DEXTROSE 2-4 GM/100ML-% IV SOLN
2.0000 g | Freq: Once | INTRAVENOUS | Status: AC
  Administered 2016-02-13: 2 g via INTRAVENOUS
  Filled 2016-02-13: qty 100

## 2016-02-13 MED ORDER — SODIUM CHLORIDE 0.9 % IR SOLN
Status: DC | PRN
  Administered 2016-02-13: 3000 mL

## 2016-02-13 MED ORDER — FENTANYL CITRATE (PF) 100 MCG/2ML IJ SOLN
INTRAMUSCULAR | Status: AC
  Administered 2016-02-13: 100 ug via INTRAVENOUS
  Filled 2016-02-13: qty 2

## 2016-02-13 MED ORDER — TETANUS-DIPHTH-ACELL PERTUSSIS 5-2.5-18.5 LF-MCG/0.5 IM SUSP
INTRAMUSCULAR | Status: AC
  Filled 2016-02-13: qty 0.5

## 2016-02-13 MED ORDER — FENTANYL CITRATE (PF) 100 MCG/2ML IJ SOLN
100.0000 ug | Freq: Once | INTRAMUSCULAR | Status: AC
  Administered 2016-02-13: 100 ug via INTRAVENOUS

## 2016-02-13 MED ORDER — CEFAZOLIN SODIUM 1 G IJ SOLR
INTRAMUSCULAR | Status: AC
  Filled 2016-02-13: qty 20

## 2016-02-13 MED ORDER — ONDANSETRON HCL 4 MG/2ML IJ SOLN
4.0000 mg | Freq: Four times a day (QID) | INTRAMUSCULAR | Status: DC | PRN
  Administered 2016-02-13 – 2016-02-16 (×2): 4 mg via INTRAVENOUS
  Filled 2016-02-13 (×2): qty 2

## 2016-02-13 MED ORDER — HYDROMORPHONE HCL 1 MG/ML IJ SOLN: 0.2000 mg | INTRAMUSCULAR | Status: DC | PRN

## 2016-02-13 MED ORDER — IOPAMIDOL (ISOVUE-300) INJECTION 61%
INTRAVENOUS | Status: AC
  Administered 2016-02-13: 100 mL
  Filled 2016-02-13: qty 100

## 2016-02-13 MED ORDER — ACETAMINOPHEN 650 MG RE SUPP: 650.0000 mg | Freq: Four times a day (QID) | RECTAL | Status: DC | PRN

## 2016-02-13 MED ORDER — METOCLOPRAMIDE HCL 5 MG/ML IJ SOLN
5.0000 mg | Freq: Three times a day (TID) | INTRAMUSCULAR | Status: DC | PRN
  Administered 2016-02-15: 10 mg via INTRAVENOUS
  Filled 2016-02-13: qty 2

## 2016-02-13 MED ORDER — HYDROMORPHONE HCL 1 MG/ML IJ SOLN
INTRAMUSCULAR | Status: AC | PRN
  Administered 2016-02-13: 1 mg via INTRAVENOUS

## 2016-02-13 MED ORDER — DIPHENHYDRAMINE HCL 25 MG PO CAPS
25.0000 mg | ORAL_CAPSULE | Freq: Once | ORAL | Status: AC
  Administered 2016-02-13: 25 mg via ORAL
  Filled 2016-02-13: qty 1

## 2016-02-13 MED ORDER — SODIUM CHLORIDE 0.9 % IV SOLN
INTRAVENOUS | Status: DC
  Administered 2016-02-14: 1 mL via INTRAVENOUS

## 2016-02-13 MED ORDER — ONDANSETRON HCL 4 MG/2ML IJ SOLN: 4.0000 mg | Freq: Four times a day (QID) | INTRAMUSCULAR | Status: DC | PRN

## 2016-02-13 MED ORDER — LIDOCAINE HCL (CARDIAC) 20 MG/ML IV SOLN
INTRAVENOUS | Status: DC | PRN
  Administered 2016-02-13: 60 mg via INTRATRACHEAL

## 2016-02-13 MED ORDER — ENOXAPARIN SODIUM 40 MG/0.4ML ~~LOC~~ SOLN: 40.0000 mg | SUBCUTANEOUS | Status: DC

## 2016-02-13 MED ORDER — PHENYLEPHRINE HCL 10 MG/ML IJ SOLN
INTRAMUSCULAR | Status: DC | PRN
  Administered 2016-02-13: 40 ug via INTRAVENOUS

## 2016-02-13 MED ORDER — LACTATED RINGERS IV SOLN
INTRAVENOUS | Status: DC | PRN
  Administered 2016-02-13 (×2): via INTRAVENOUS

## 2016-02-13 MED ORDER — GENTAMICIN SULFATE 40 MG/ML IJ SOLN
2.0000 mg/kg | Freq: Once | INTRAMUSCULAR | Status: AC
  Administered 2016-02-13: 220 mg via INTRAVENOUS
  Filled 2016-02-13: qty 5.5

## 2016-02-13 MED ORDER — POLYETHYLENE GLYCOL 3350 17 G PO PACK
17.0000 g | PACK | Freq: Every day | ORAL | Status: DC | PRN
  Administered 2016-02-17: 17 g via ORAL
  Filled 2016-02-13: qty 1

## 2016-02-13 MED ORDER — SUCCINYLCHOLINE CHLORIDE 20 MG/ML IJ SOLN
INTRAMUSCULAR | Status: DC | PRN
  Administered 2016-02-13: 120 mg via INTRAVENOUS

## 2016-02-13 MED ORDER — DOCUSATE SODIUM 100 MG PO CAPS
100.0000 mg | ORAL_CAPSULE | Freq: Two times a day (BID) | ORAL | Status: DC
  Administered 2016-02-14 (×2): 100 mg via ORAL

## 2016-02-13 MED ORDER — SODIUM CHLORIDE 0.9 % IV SOLN
INTRAVENOUS | Status: DC
  Administered 2016-02-13 – 2016-02-14 (×3): via INTRAVENOUS

## 2016-02-13 MED ORDER — HYDROMORPHONE HCL 1 MG/ML IJ SOLN
0.2500 mg | INTRAMUSCULAR | Status: DC | PRN
  Administered 2016-02-13 (×4): 0.5 mg via INTRAVENOUS

## 2016-02-13 MED ORDER — ACETAMINOPHEN 325 MG PO TABS
650.0000 mg | ORAL_TABLET | Freq: Four times a day (QID) | ORAL | Status: DC | PRN
  Administered 2016-02-15: 650 mg via ORAL
  Filled 2016-02-13: qty 2

## 2016-02-13 MED ORDER — OXYCODONE HCL 5 MG PO TABS
2.5000 mg | ORAL_TABLET | ORAL | Status: DC | PRN
  Administered 2016-02-14: 2.5 mg via ORAL
  Filled 2016-02-13: qty 1

## 2016-02-13 SURGICAL SUPPLY — 62 items
BANDAGE ACE 4X5 VEL STRL LF (GAUZE/BANDAGES/DRESSINGS) ×2 IMPLANT
BANDAGE ELASTIC 4 VELCRO ST LF (GAUZE/BANDAGES/DRESSINGS) ×4 IMPLANT
BANDAGE ELASTIC 6 VELCRO ST LF (GAUZE/BANDAGES/DRESSINGS) ×4 IMPLANT
BNDG ADH 5X4 AIR PERM ELC (GAUZE/BANDAGES/DRESSINGS) ×2
BNDG COHESIVE 4X5 WHT NS (GAUZE/BANDAGES/DRESSINGS) ×2 IMPLANT
BNDG GAUZE ELAST 4 BULKY (GAUZE/BANDAGES/DRESSINGS) ×2 IMPLANT
CUFF TOURNIQUET SINGLE 18IN (TOURNIQUET CUFF) ×2 IMPLANT
CUFF TOURNIQUET SINGLE 24IN (TOURNIQUET CUFF) IMPLANT
CUFF TOURNIQUET SINGLE 34IN LL (TOURNIQUET CUFF) IMPLANT
CUFF TOURNIQUET SINGLE 44IN (TOURNIQUET CUFF) IMPLANT
DRAPE SURG 17X23 STRL (DRAPES) ×4 IMPLANT
DRAPE U-SHAPE 47X51 STRL (DRAPES) ×6 IMPLANT
DRSG EMULSION OIL 3X3 NADH (GAUZE/BANDAGES/DRESSINGS) ×2 IMPLANT
DRSG PAD ABDOMINAL 8X10 ST (GAUZE/BANDAGES/DRESSINGS) ×6 IMPLANT
ELECT REM PT RETURN 9FT ADLT (ELECTROSURGICAL) ×4
ELECTRODE REM PT RTRN 9FT ADLT (ELECTROSURGICAL) IMPLANT
FACESHIELD WRAPAROUND (MASK) IMPLANT
FACESHIELD WRAPAROUND OR TEAM (MASK) ×2 IMPLANT
GAUZE SPONGE 4X4 12PLY STRL (GAUZE/BANDAGES/DRESSINGS) ×6 IMPLANT
GAUZE XEROFORM 5X9 LF (GAUZE/BANDAGES/DRESSINGS) ×4 IMPLANT
GLOVE BIOGEL PI IND STRL 7.5 (GLOVE) ×2 IMPLANT
GLOVE BIOGEL PI IND STRL 8 (GLOVE) ×2 IMPLANT
GLOVE BIOGEL PI INDICATOR 7.5 (GLOVE) ×2
GLOVE BIOGEL PI INDICATOR 8 (GLOVE) ×2
GLOVE ECLIPSE 8.0 STRL XLNG CF (GLOVE) ×4 IMPLANT
GLOVE ORTHO TXT STRL SZ7.5 (GLOVE) ×4 IMPLANT
GLOVE SURG ORTHO 8.0 STRL STRW (GLOVE) ×4 IMPLANT
GOWN STRL REIN 3XL XLG LVL4 (GOWN DISPOSABLE) ×2 IMPLANT
GOWN STRL REUS W/ TWL LRG LVL3 (GOWN DISPOSABLE) ×6 IMPLANT
GOWN STRL REUS W/TWL LRG LVL3 (GOWN DISPOSABLE) ×8
HANDPIECE INTERPULSE COAX TIP (DISPOSABLE) ×4
IMMOBILIZER KNEE 20 (SOFTGOODS) ×4
IMMOBILIZER KNEE 20 THIGH 36 (SOFTGOODS) IMPLANT
KIT BASIN OR (CUSTOM PROCEDURE TRAY) ×4 IMPLANT
KIT ROOM TURNOVER OR (KITS) ×4 IMPLANT
MANIFOLD NEPTUNE II (INSTRUMENTS) ×4 IMPLANT
NS IRRIG 1000ML POUR BTL (IV SOLUTION) ×4 IMPLANT
PACK ORTHO EXTREMITY (CUSTOM PROCEDURE TRAY) ×4 IMPLANT
PAD ARMBOARD 7.5X6 YLW CONV (MISCELLANEOUS) ×8 IMPLANT
PAD CAST 4YDX4 CTTN HI CHSV (CAST SUPPLIES) IMPLANT
PADDING CAST COTTON 4X4 STRL (CAST SUPPLIES) ×4
PADDING CAST COTTON 6X4 STRL (CAST SUPPLIES) ×2 IMPLANT
SCRUB POVIDONE IODINE 4 OZ (MISCELLANEOUS) ×4 IMPLANT
SET HNDPC FAN SPRY TIP SCT (DISPOSABLE) IMPLANT
SPONGE GAUZE 4X4 12PLY STER LF (GAUZE/BANDAGES/DRESSINGS) ×4 IMPLANT
SPONGE LAP 18X18 X RAY DECT (DISPOSABLE) ×4 IMPLANT
SPONGE LAP 4X18 X RAY DECT (DISPOSABLE) ×2 IMPLANT
STOCKINETTE IMPERVIOUS 9X36 MD (GAUZE/BANDAGES/DRESSINGS) ×6 IMPLANT
SUCTION FRAZIER HANDLE 10FR (MISCELLANEOUS)
SUCTION TUBE FRAZIER 10FR DISP (MISCELLANEOUS) IMPLANT
SUT ETHILON 2 0 FS 18 (SUTURE) ×4 IMPLANT
SUT ETHILON 3 0 PS 1 (SUTURE) ×2 IMPLANT
SUT SILK 2 0 FS (SUTURE) ×2 IMPLANT
TOWEL OR 17X24 6PK STRL BLUE (TOWEL DISPOSABLE) ×4 IMPLANT
TOWEL OR 17X26 10 PK STRL BLUE (TOWEL DISPOSABLE) ×4 IMPLANT
TRAY FOLEY CATH SILVER 16FR LF (SET/KITS/TRAYS/PACK) ×2 IMPLANT
TUBE ANAEROBIC SPECIMEN COL (MISCELLANEOUS) IMPLANT
TUBE CONNECTING 12'X1/4 (SUCTIONS) ×1
TUBE CONNECTING 12X1/4 (SUCTIONS) ×3 IMPLANT
UNDERPAD 30X30 (UNDERPADS AND DIAPERS) ×4 IMPLANT
WATER STERILE IRR 1000ML POUR (IV SOLUTION) ×2 IMPLANT
YANKAUER SUCT BULB TIP NO VENT (SUCTIONS) ×4 IMPLANT
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
## 2016-02-13 NOTE — Progress Notes (Addendum)
   02/13/16 1400  Clinical Encounter Type  Visited With Family  Visit Type Trauma  Consult/Referral To None  Spiritual Encounters  Spiritual Needs Emotional  Stress Factors  Family Stress Factors None identified  Advance Directives (For Healthcare)  Would patient like information on creating an advanced directive? No - patient declined information    Chaplain brought family back to consult room B. Family took turns visiting after patient returned from CT scan. Offered emotional support and hospitality to family as needed.
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
## 2016-02-13 NOTE — Anesthesia Postprocedure Evaluation (Signed)
Anesthesia Post Note  Patient: Molly MaduroSusan Hodges  Procedure(s) Performed: Procedure(s) (LRB): IRRIGATION AND DEBRIDEMENT RIGHT FOOT WITH CLOSED REDUCTION BILATERAL LOWER EXTREMITIES (Bilateral) IRRIGATION AND DEBRIDEMENT EXTREMITY (Right)  Patient location during evaluation: PACU Anesthesia Type: General Level of consciousness: awake and alert Pain management: pain level controlled Vital Signs Assessment: post-procedure vital signs reviewed and stable Respiratory status: spontaneous breathing, nonlabored ventilation and respiratory function stable Cardiovascular status: blood pressure returned to baseline and stable Postop Assessment: no signs of nausea or vomiting Anesthetic complications: no    Last Vitals:  Vitals:   02/13/16 2033 02/13/16 2046  BP: 135/71 133/76  Pulse: 73 88  Resp: 14 16  Temp: 37.4 C 36.7 C    Last Pain:  Vitals:   02/13/16 2046  TempSrc: Oral  PainSc:                  Justen Fonda,W. EDMOND

## 2016-02-13 NOTE — Anesthesia Procedure Notes (Signed)
Procedure Name: Intubation Date/Time: 02/13/2016 6:47 PM Performed by: Brien MatesMAHONY, Azarel Banner D Pre-anesthesia Checklist: Patient identified, Emergency Drugs available, Suction available, Patient being monitored and Timeout performed Patient Re-evaluated:Patient Re-evaluated prior to inductionOxygen Delivery Method: Circle system utilized Preoxygenation: Pre-oxygenation with 100% oxygen Intubation Type: IV induction, Rapid sequence and Cricoid Pressure applied Laryngoscope Size: Glidescope (Elective Glidescope) Grade View: Grade I Tube type: Oral Tube size: 7.5 mm Number of attempts: 1 Airway Equipment and Method: Stylet and Video-laryngoscopy Placement Confirmation: ETT inserted through vocal cords under direct vision and breath sounds checked- equal and bilateral Secured at: 21 cm Tube secured with: Tape Dental Injury: Teeth and Oropharynx as per pre-operative assessment
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
## 2016-02-13 NOTE — ED Notes (Signed)
Report given to OR holding  RN  

## 2016-02-13 NOTE — Transfer of Care (Signed)
Immediate Anesthesia Transfer of Care Note  Patient: Molly MaduroSusan Heffron  Procedure(s) Performed: Procedure(s): IRRIGATION AND DEBRIDEMENT RIGHT FOOT WITH CLOSED REDUCTION BILATERAL LOWER EXTREMITIES (Bilateral) IRRIGATION AND DEBRIDEMENT EXTREMITY (Right)  Patient Location: PACU  Anesthesia Type:General  Level of Consciousness: sedated  Airway & Oxygen Therapy: Patient Spontanous Breathing and Patient connected to face mask oxygen  Post-op Assessment: Report given to RN and Post -op Vital signs reviewed and stable  Post vital signs: Reviewed and stable  Last Vitals:  Vitals:   02/13/16 1615 02/13/16 1630  BP: (!) 137/102 139/100  Pulse: 85 99  Resp: 10 14  Temp:      Last Pain:  Vitals:   02/13/16 1801  TempSrc:   PainSc: 7          Complications: No apparent anesthesia complications

## 2016-02-13 NOTE — Brief Op Note (Signed)
02/13/2016  5:24 PM  PATIENT:  Molly MaduroSusan Potocki  40 y.o. female  PRE-OPERATIVE DIAGNOSIS:  1. Grade II open fracture dislocation 1st MTP joint, right 2nd, 3rd, 4th MT fractures.  2.  Left distal tibia fibula fracture  POST-OPERATIVE DIAGNOSIS:  1. Grade II open fracture dislocation 1st MTP joint, right 2nd, 3rd, 4th MT fractures.  2.  Left distal tibia fibula fracture  PROCEDURE:  Procedure(s): 1.  I&D right foot wound with closed reduction of 1st MTP joint 2. Closed reduction and short leg splint to right foot 3. Closed reduction left distal tibia fibula fracture  SURGEON:  Surgeon(s) and Role:    * Durene RomansMatthew Vivian Okelley, MD - Primary  PHYSICIAN ASSISTANT: None  ASSISTANTS: Surgical team  ANESTHESIA:   general  EBL:  Total I/O In: 550 [I.V.:550] Out: -   BLOOD ADMINISTERED:none  DRAINS: none   LOCAL MEDICATIONS USED:  NONE  SPECIMEN:  No Specimen  DISPOSITION OF SPECIMEN:  N/A  COUNTS:  YES  TOURNIQUET:  * No tourniquets in log *  DICTATION: .Other Dictation: Dictation Number [id number]  PLAN OF CARE: Admit to inpatient   PATIENT DISPOSITION:  PACU - hemodynamically stable.   Delay start of Pharmacological VTE agent (>24hrs) due to surgical blood loss or risk of bleeding: no

## 2016-02-13 NOTE — Anesthesia Preprocedure Evaluation (Addendum)
Anesthesia Evaluation  Patient identified by MRN, date of birth, ID band Patient awake    Reviewed: Allergy & Precautions, H&P , NPO status , Patient's Chart, lab work & pertinent test results  Airway Mallampati: III  TM Distance: >3 FB Neck ROM: Full    Dental no notable dental hx. (+) Teeth Intact, Dental Advisory Given, Poor Dentition   Pulmonary neg pulmonary ROS,    Pulmonary exam normal breath sounds clear to auscultation       Cardiovascular negative cardio ROS   Rhythm:Regular Rate:Normal     Neuro/Psych negative neurological ROS  negative psych ROS   GI/Hepatic negative GI ROS, Neg liver ROS,   Endo/Other  Morbid obesity  Renal/GU negative Renal ROS  negative genitourinary   Musculoskeletal   Abdominal   Peds  Hematology negative hematology ROS (+)   Anesthesia Other Findings   Reproductive/Obstetrics negative OB ROS                           Anesthesia Physical Anesthesia Plan  ASA: II and emergent  Anesthesia Plan: General   Post-op Pain Management:    Induction: Intravenous, Rapid sequence and Cricoid pressure planned  Airway Management Planned: Oral ETT  Additional Equipment:   Intra-op Plan:   Post-operative Plan: Extubation in OR  Informed Consent: I have reviewed the patients History and Physical, chart, labs and discussed the procedure including the risks, benefits and alternatives for the proposed anesthesia with the patient or authorized representative who has indicated his/her understanding and acceptance.   Dental advisory given  Plan Discussed with: CRNA, Anesthesiologist and Surgeon  Anesthesia Plan Comments:       Anesthesia Quick Evaluation

## 2016-02-13 NOTE — Progress Notes (Addendum)
Orthopedic Tech Progress Note Patient Details:  Priscilla MaduroSusan Jacobs 12-26-1975 [id number] Dressed open wound with betadine, gauze and kerlix loosely under fiberglass splint. Ortho Devices Type of Ortho Device: Ace wrap, Post (short leg) splint Ortho Device/Splint Interventions: Ordered, Application   Clois Dupesvery S Navraj Dreibelbis 02/13/2016, 7:14 PM

## 2016-02-13 NOTE — ED Notes (Signed)
Family at bedside. 

## 2016-02-14 ENCOUNTER — Inpatient Hospital Stay (HOSPITAL_COMMUNITY): Payer: Medicaid Other

## 2016-02-14 ENCOUNTER — Encounter (HOSPITAL_COMMUNITY): Payer: Self-pay

## 2016-02-14 LAB — COMPREHENSIVE METABOLIC PANEL
ALK PHOS: 55 U/L (ref 38–126)
ALT: 47 U/L (ref 14–54)
ANION GAP: 8 (ref 5–15)
AST: 56 U/L — ABNORMAL HIGH (ref 15–41)
Albumin: 2.7 g/dL — ABNORMAL LOW (ref 3.5–5.0)
BILIRUBIN TOTAL: 0.4 mg/dL (ref 0.3–1.2)
BUN: 7 mg/dL (ref 6–20)
CALCIUM: 7.6 mg/dL — AB (ref 8.9–10.3)
CO2: 24 mmol/L (ref 22–32)
Chloride: 105 mmol/L (ref 101–111)
Creatinine, Ser: 0.79 mg/dL (ref 0.44–1.00)
GFR calc Af Amer: >60 mL/min (ref >60)
Glucose, Bld: 139 mg/dL — ABNORMAL HIGH (ref 65–99)
POTASSIUM: 4.1 mmol/L (ref 3.5–5.1)
Sodium: 137 mmol/L (ref 135–145)
TOTAL PROTEIN: 5.6 g/dL — AB (ref 6.5–8.1)

## 2016-02-14 LAB — CBC
HEMATOCRIT: 34.1 % — AB (ref 36.0–46.0)
HEMOGLOBIN: 10.6 g/dL — AB (ref 12.0–15.0)
MCH: 27.7 pg (ref 26.0–34.0)
MCHC: 31.1 g/dL (ref 30.0–36.0)
MCV: 89 fL (ref 78.0–100.0)
Platelets: 305 10*3/uL (ref 150–400)
RBC: 3.83 MIL/uL — ABNORMAL LOW (ref 3.87–5.11)
RDW: 13.7 % (ref 11.5–15.5)
WBC: 7.8 10*3/uL (ref 4.0–10.5)

## 2016-02-14 MED ORDER — OXYCODONE HCL 5 MG PO TABS
5.0000 mg | ORAL_TABLET | ORAL | Status: DC | PRN
  Administered 2016-02-14 – 2016-02-15 (×4): 10 mg via ORAL
  Filled 2016-02-14 (×4): qty 2

## 2016-02-14 MED ORDER — HYDROMORPHONE HCL 1 MG/ML IJ SOLN
0.5000 mg | INTRAMUSCULAR | Status: DC | PRN
  Administered 2016-02-14 (×2): 1 mg via INTRAVENOUS
  Administered 2016-02-14: 2 mg via INTRAVENOUS
  Administered 2016-02-15: 1 mg via INTRAVENOUS
  Filled 2016-02-14: qty 1
  Filled 2016-02-14: qty 2
  Filled 2016-02-14 (×2): qty 1

## 2016-02-14 NOTE — Progress Notes (Signed)
New Admission Note:   Arrival Method: Via bed from OR/Pacu Mental Orientation: Alert and oriented x4 Telemetry: N/A Assessment: Completed Skin: See doc flowsheet IV: Rt H,Rt AC Pain: 9/10 Tubes: Foley Safety Measures: Safety Fall Prevention Plan has been  Discussed.                                                               6 [place] Orientation: Patient has been orientated to the room and unit Family: Husband,daughter at bedside  Orders have been reviewed and implemented. Will continue to monitor the patient. Call light has been placed within reach and bed alarm has been activated.   Lemuel Boodram [employer] Corporation[employer] BSN, RN-BC Phone number: [phone]

## 2016-02-14 NOTE — Progress Notes (Signed)
   Subjective: 1 Day Post-Op Procedure(s) (LRB): IRRIGATION AND DEBRIDEMENT RIGHT FOOT WITH CLOSED REDUCTION BILATERAL LOWER EXTREMITIES (Bilateral) IRRIGATION AND DEBRIDEMENT EXTREMITY (Right)  C/o moderate to severe pain in bilateral lower extremities today Concerned about her progress and further surgeries Remains bedrest only Patient reports pain as severe.  Objective:   VITALS:   Vitals:   02/14/16 0242 02/14/16 0454  BP: 123/60 116/61  Pulse: 75 87  Resp: 18 19  Temp: 97.6 F (36.4 C) 97.8 F (36.6 C)    Bilateral lower extremities in splint and knee immobilizer on the left nv intact distally Cap refill less than 2 seconds  LABS  Recent Labs  02/13/16 1350 02/13/16 1357 02/14/16 0610  HGB 13.7 14.3 10.6*  HCT 42.4 42.0 34.1*  WBC 15.9*  --  7.8  PLT 346  --  305     Recent Labs  02/13/16 1350 02/13/16 1357 02/14/16 0610  NA 140 140 137  K 3.6 3.5 4.1  BUN 12 14 7   CREATININE 0.91 0.90 0.79  GLUCOSE 100* 99 139*     Assessment/Plan: 1 Day Post-Op Procedure(s) (LRB): IRRIGATION AND DEBRIDEMENT RIGHT FOOT WITH CLOSED REDUCTION BILATERAL LOWER EXTREMITIES (Bilateral) IRRIGATION AND DEBRIDEMENT EXTREMITY (Right) CTs are scheduled to better evaluate her fractures Will discuss case with Dr. Victorino DikeHewitt and Dr. Carola FrostHandy this week Continue strict bedrest with non weight bearing bilateral lower extremities Pain management    Alphonsa OverallBrad Tramain Gershman, MPAS, PA-C  02/14/2016, 8:27 AM
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
## 2016-02-15 ENCOUNTER — Encounter (HOSPITAL_COMMUNITY): Payer: Self-pay | Admitting: Orthopedic Surgery

## 2016-02-15 DIAGNOSIS — S82402A Unspecified fracture of shaft of left fibula, initial encounter for closed fracture: Secondary | ICD-10-CM

## 2016-02-15 DIAGNOSIS — S36892A Contusion of other intra-abdominal organs, initial encounter: Secondary | ICD-10-CM | POA: Diagnosis present

## 2016-02-15 DIAGNOSIS — S82202A Unspecified fracture of shaft of left tibia, initial encounter for closed fracture: Secondary | ICD-10-CM | POA: Diagnosis present

## 2016-02-15 DIAGNOSIS — D62 Acute posthemorrhagic anemia: Secondary | ICD-10-CM | POA: Diagnosis not present

## 2016-02-15 DIAGNOSIS — S92901A Unspecified fracture of right foot, initial encounter for closed fracture: Secondary | ICD-10-CM | POA: Diagnosis present

## 2016-02-15 LAB — SURGICAL PCR SCREEN
MRSA, PCR: NEGATIVE
STAPHYLOCOCCUS AUREUS: NEGATIVE

## 2016-02-15 MED ORDER — KCL IN DEXTROSE-NACL 20-5-0.45 MEQ/L-%-% IV SOLN
INTRAVENOUS | Status: DC
  Administered 2016-02-16: via INTRAVENOUS
  Filled 2016-02-15 (×2): qty 1000

## 2016-02-15 MED ORDER — MORPHINE SULFATE (PF) 2 MG/ML IV SOLN
2.0000 mg | INTRAVENOUS | Status: DC | PRN
Start: 2016-02-15 — End: 2016-02-22
  Administered 2016-02-15 – 2016-02-22 (×17): 2 mg via INTRAVENOUS
  Filled 2016-02-15 (×17): qty 1

## 2016-02-15 MED ORDER — CHLORHEXIDINE GLUCONATE 4 % EX LIQD: 60.0000 mL | Freq: Once | CUTANEOUS | Status: DC

## 2016-02-15 MED ORDER — CHLORHEXIDINE GLUCONATE 4 % EX LIQD
60.0000 mL | Freq: Once | CUTANEOUS | Status: AC
  Administered 2016-02-16: 4 via TOPICAL
  Filled 2016-02-15: qty 60

## 2016-02-15 MED ORDER — POVIDONE-IODINE 10 % EX SWAB: 2.0000 "application " | Freq: Once | CUTANEOUS | Status: DC

## 2016-02-15 MED ORDER — ENOXAPARIN SODIUM 30 MG/0.3ML ~~LOC~~ SOLN
30.0000 mg | Freq: Two times a day (BID) | SUBCUTANEOUS | Status: DC
  Administered 2016-02-15 – 2016-02-18 (×6): 30 mg via SUBCUTANEOUS
  Filled 2016-02-15 (×6): qty 0.3

## 2016-02-15 MED ORDER — OXYCODONE HCL 5 MG PO TABS
10.0000 mg | ORAL_TABLET | ORAL | Status: DC | PRN
  Administered 2016-02-15 (×3): 20 mg via ORAL
  Administered 2016-02-16: 10 mg via ORAL
  Administered 2016-02-16: 20 mg via ORAL
  Administered 2016-02-16: 10 mg via ORAL
  Administered 2016-02-16 – 2016-02-19 (×8): 20 mg via ORAL
  Administered 2016-02-20 (×2): 10 mg via ORAL
  Administered 2016-02-20 – 2016-02-22 (×8): 20 mg via ORAL
  Filled 2016-02-15: qty 2
  Filled 2016-02-15 (×10): qty 4
  Filled 2016-02-15: qty 2
  Filled 2016-02-15 (×10): qty 4
  Filled 2016-02-15: qty 2
  Filled 2016-02-15: qty 4

## 2016-02-15 MED ORDER — CEFAZOLIN SODIUM-DEXTROSE 2-4 GM/100ML-% IV SOLN
2.0000 g | INTRAVENOUS | Status: AC
  Administered 2016-02-16: 2 g via INTRAVENOUS
  Filled 2016-02-15 (×2): qty 100
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
## 2016-02-16 ENCOUNTER — Inpatient Hospital Stay (HOSPITAL_COMMUNITY): Payer: Medicaid Other | Admitting: Anesthesiology

## 2016-02-16 ENCOUNTER — Encounter (HOSPITAL_COMMUNITY): Payer: Self-pay | Admitting: Anesthesiology

## 2016-02-16 ENCOUNTER — Encounter (HOSPITAL_COMMUNITY): Admission: EM | Disposition: A | Discharge status: Rehabilitation Facility | Payer: Self-pay | Source: Home / Self Care

## 2016-02-16 HISTORY — PX: EXTERNAL FIXATION LEG: SHX1549

## 2016-02-16 HISTORY — PX: ANKLE RECONSTRUCTION: SHX1151

## 2016-02-16 LAB — CBC
HCT: 29.9 % — ABNORMAL LOW (ref 36.0–46.0)
Hemoglobin: 9.5 g/dL — ABNORMAL LOW (ref 12.0–15.0)
MCH: 28.1 pg (ref 26.0–34.0)
MCHC: 31.8 g/dL (ref 30.0–36.0)
MCV: 88.5 fL (ref 78.0–100.0)
PLATELETS: 283 10*3/uL (ref 150–400)
RBC: 3.38 MIL/uL — ABNORMAL LOW (ref 3.87–5.11)
RDW: 13.6 % (ref 11.5–15.5)
WBC: 6.6 10*3/uL (ref 4.0–10.5)

## 2016-02-16 SURGERY — RECONSTRUCTION, ANKLE
Anesthesia: Regional | Site: Leg Lower | Laterality: Right

## 2016-02-16 MED ORDER — ONDANSETRON HCL 4 MG/2ML IJ SOLN
4.0000 mg | Freq: Four times a day (QID) | INTRAMUSCULAR | Status: DC | PRN
  Administered 2016-02-16 – 2016-02-21 (×7): 4 mg via INTRAVENOUS
  Filled 2016-02-16 (×8): qty 2

## 2016-02-16 MED ORDER — ONDANSETRON HCL 4 MG/2ML IJ SOLN: 4.0000 mg | Freq: Once | INTRAMUSCULAR | Status: DC | PRN

## 2016-02-16 MED ORDER — ALBUMIN HUMAN 5 % IV SOLN
INTRAVENOUS | Status: DC | PRN
  Administered 2016-02-16: 14:00:00 via INTRAVENOUS

## 2016-02-16 MED ORDER — MEPERIDINE HCL 25 MG/ML IJ SOLN: 6.2500 mg | INTRAMUSCULAR | Status: DC | PRN

## 2016-02-16 MED ORDER — LIDOCAINE HCL (CARDIAC) 20 MG/ML IV SOLN
INTRAVENOUS | Status: DC | PRN
  Administered 2016-02-16: 100 mg via INTRAVENOUS

## 2016-02-16 MED ORDER — LIDOCAINE 2% (20 MG/ML) 5 ML SYRINGE
INTRAMUSCULAR | Status: AC
  Filled 2016-02-16: qty 5

## 2016-02-16 MED ORDER — SUGAMMADEX SODIUM 200 MG/2ML IV SOLN
INTRAVENOUS | Status: DC | PRN
  Administered 2016-02-16: 200 mg via INTRAVENOUS

## 2016-02-16 MED ORDER — FENTANYL CITRATE (PF) 100 MCG/2ML IJ SOLN
INTRAMUSCULAR | Status: AC
  Filled 2016-02-16: qty 2

## 2016-02-16 MED ORDER — MIDAZOLAM HCL 2 MG/2ML IJ SOLN
INTRAMUSCULAR | Status: AC
  Filled 2016-02-16: qty 2

## 2016-02-16 MED ORDER — 0.9 % SODIUM CHLORIDE (POUR BTL) OPTIME
TOPICAL | Status: DC | PRN
  Administered 2016-02-16: 1000 mL

## 2016-02-16 MED ORDER — ROCURONIUM BROMIDE 10 MG/ML (PF) SYRINGE
PREFILLED_SYRINGE | INTRAVENOUS | Status: AC
  Filled 2016-02-16: qty 10

## 2016-02-16 MED ORDER — HYDROMORPHONE HCL 1 MG/ML IJ SOLN: 0.2500 mg | INTRAMUSCULAR | Status: DC | PRN

## 2016-02-16 MED ORDER — ROCURONIUM BROMIDE 100 MG/10ML IV SOLN
INTRAVENOUS | Status: DC | PRN
  Administered 2016-02-16: 10 mg via INTRAVENOUS
  Administered 2016-02-16: 50 mg via INTRAVENOUS

## 2016-02-16 MED ORDER — LACTATED RINGERS IV SOLN
INTRAVENOUS | Status: DC
  Administered 2016-02-16 (×2): via INTRAVENOUS
  Administered 2016-02-16: 50 mL/h via INTRAVENOUS

## 2016-02-16 MED ORDER — FENTANYL CITRATE (PF) 100 MCG/2ML IJ SOLN
INTRAMUSCULAR | Status: DC | PRN
  Administered 2016-02-16: 100 ug via INTRAVENOUS
  Administered 2016-02-16 (×4): 50 ug via INTRAVENOUS

## 2016-02-16 MED ORDER — METOCLOPRAMIDE HCL 5 MG/ML IJ SOLN
5.0000 mg | Freq: Three times a day (TID) | INTRAMUSCULAR | Status: DC | PRN
  Administered 2016-02-18 – 2016-02-20 (×2): 10 mg via INTRAVENOUS
  Filled 2016-02-16 (×2): qty 2

## 2016-02-16 MED ORDER — BACITRACIN-NEOMYCIN-POLYMYXIN OINTMENT TUBE
TOPICAL_OINTMENT | Freq: Every day | CUTANEOUS | Status: DC
  Administered 2016-02-17 – 2016-02-22 (×5): via TOPICAL
  Filled 2016-02-16: qty 15

## 2016-02-16 MED ORDER — PROPOFOL 10 MG/ML IV BOLUS
INTRAVENOUS | Status: DC | PRN
  Administered 2016-02-16: 100 mg via INTRAVENOUS

## 2016-02-16 MED ORDER — DOCUSATE SODIUM 100 MG PO CAPS
200.0000 mg | ORAL_CAPSULE | Freq: Two times a day (BID) | ORAL | Status: DC
  Administered 2016-02-16 – 2016-02-21 (×10): 200 mg via ORAL
  Administered 2016-02-22: 100 mg via ORAL
  Filled 2016-02-16 (×12): qty 2

## 2016-02-16 MED ORDER — ONDANSETRON HCL 4 MG/2ML IJ SOLN
INTRAMUSCULAR | Status: DC | PRN
  Administered 2016-02-16: 4 mg via INTRAVENOUS

## 2016-02-16 MED ORDER — METOCLOPRAMIDE HCL 5 MG PO TABS: 5.0000 mg | ORAL_TABLET | Freq: Three times a day (TID) | ORAL | Status: DC | PRN

## 2016-02-16 MED ORDER — SENNA 8.6 MG PO TABS
1.0000 | ORAL_TABLET | Freq: Two times a day (BID) | ORAL | Status: DC
  Administered 2016-02-16 – 2016-02-17 (×3): 8.6 mg via ORAL
  Filled 2016-02-16 (×3): qty 1

## 2016-02-16 MED ORDER — PROPOFOL 10 MG/ML IV BOLUS
INTRAVENOUS | Status: AC
  Filled 2016-02-16: qty 40

## 2016-02-16 MED ORDER — MIDAZOLAM HCL 5 MG/5ML IJ SOLN
INTRAMUSCULAR | Status: DC | PRN
  Administered 2016-02-16: 2 mg via INTRAVENOUS

## 2016-02-16 MED ORDER — ONDANSETRON HCL 4 MG PO TABS: 4.0000 mg | ORAL_TABLET | Freq: Four times a day (QID) | ORAL | Status: DC | PRN

## 2016-02-16 MED ORDER — ONDANSETRON HCL 4 MG/2ML IJ SOLN
INTRAMUSCULAR | Status: AC
  Filled 2016-02-16: qty 2

## 2016-02-16 MED ORDER — HYDROMORPHONE HCL 1 MG/ML IJ SOLN
INTRAMUSCULAR | Status: AC
  Filled 2016-02-16: qty 1

## 2016-02-16 MED ORDER — FENTANYL CITRATE (PF) 100 MCG/2ML IJ SOLN
INTRAMUSCULAR | Status: AC
  Administered 2016-02-16: 100 ug
  Filled 2016-02-16: qty 2

## 2016-02-16 MED ORDER — SODIUM CHLORIDE 0.9 % IR SOLN
Status: DC | PRN
  Administered 2016-02-16: 1000 mL

## 2016-02-16 MED ORDER — MIDAZOLAM HCL 2 MG/2ML IJ SOLN
INTRAMUSCULAR | Status: AC
  Administered 2016-02-16: 2 mg
  Filled 2016-02-16: qty 2

## 2016-02-16 MED ORDER — MECLIZINE HCL 25 MG PO TABS
25.0000 mg | ORAL_TABLET | Freq: Three times a day (TID) | ORAL | Status: DC | PRN
  Administered 2016-02-17: 25 mg via ORAL
  Filled 2016-02-16 (×3): qty 1

## 2016-02-16 MED ORDER — PHENYLEPHRINE HCL 10 MG/ML IJ SOLN
INTRAVENOUS | Status: DC | PRN
  Administered 2016-02-16: 20 ug/min via INTRAVENOUS

## 2016-02-16 MED ORDER — HYDROMORPHONE HCL 1 MG/ML IJ SOLN
0.2500 mg | INTRAMUSCULAR | Status: DC | PRN
  Administered 2016-02-16 (×2): 0.5 mg via INTRAVENOUS

## 2016-02-16 SURGICAL SUPPLY — 76 items
BANDAGE ACE 4X5 VEL STRL LF (GAUZE/BANDAGES/DRESSINGS) ×4 IMPLANT
BANDAGE ACE 6X5 VEL STRL LF (GAUZE/BANDAGES/DRESSINGS) ×2 IMPLANT
BANDAGE ESMARK 6X9 LF (GAUZE/BANDAGES/DRESSINGS) ×2 IMPLANT
BAR EXFX 350X11 NS LF (EXFIX) ×4
BAR GLASS FIBER EXFX 11X350 (EXFIX) ×4 IMPLANT
BIT DRILL CANN MED FLUTE 4.0 (BIT) IMPLANT
BLADE SURG 10 STRL SS (BLADE) ×4 IMPLANT
BNDG CMPR 9X6 STRL LF SNTH (GAUZE/BANDAGES/DRESSINGS) ×2
BNDG COHESIVE 4X5 TAN STRL (GAUZE/BANDAGES/DRESSINGS) ×4 IMPLANT
BNDG COHESIVE 6X5 TAN STRL LF (GAUZE/BANDAGES/DRESSINGS) ×4 IMPLANT
BNDG ESMARK 6X9 LF (GAUZE/BANDAGES/DRESSINGS) ×4
BNDG GAUZE ELAST 4 BULKY (GAUZE/BANDAGES/DRESSINGS) ×4 IMPLANT
CANISTER SUCT 3000ML PPV (MISCELLANEOUS) ×2 IMPLANT
CHLORAPREP W/TINT 26ML (MISCELLANEOUS) ×6 IMPLANT
CLAMP BLUE BAR TO PIN (EXFIX) ×4 IMPLANT
COVER SURGICAL LIGHT HANDLE (MISCELLANEOUS) ×4 IMPLANT
CUFF TOURNIQUET SINGLE 34IN LL (TOURNIQUET CUFF) ×4 IMPLANT
CUFF TOURNIQUET SINGLE 44IN (TOURNIQUET CUFF) IMPLANT
DRAPE C-ARM 42X72 X-RAY (DRAPES) ×2 IMPLANT
DRAPE EXTREMITY T 121X128X90 (DRAPE) ×2 IMPLANT
DRAPE HALF SHEET 40X57 (DRAPES) ×4 IMPLANT
DRAPE OEC MINIVIEW 54X84 (DRAPES) ×2 IMPLANT
DRILL CANN 4.0MM (BIT) ×4
DRSG ADAPTIC 3X8 NADH LF (GAUZE/BANDAGES/DRESSINGS) IMPLANT
DRSG MEPITEL 4X7.2 (GAUZE/BANDAGES/DRESSINGS) ×2 IMPLANT
DRSG PAD ABDOMINAL 8X10 ST (GAUZE/BANDAGES/DRESSINGS) ×4 IMPLANT
ELECT REM PT RETURN 9FT ADLT (ELECTROSURGICAL) ×4
ELECTRODE REM PT RTRN 9FT ADLT (ELECTROSURGICAL) ×2 IMPLANT
GAUZE SPONGE 4X4 12PLY STRL (GAUZE/BANDAGES/DRESSINGS) ×2 IMPLANT
GAUZE XEROFORM 5X9 LF (GAUZE/BANDAGES/DRESSINGS) ×2 IMPLANT
GLOVE BIO SURGEON STRL SZ7 (GLOVE) ×8 IMPLANT
GLOVE BIO SURGEON STRL SZ8 (GLOVE) ×8 IMPLANT
GLOVE BIOGEL PI IND STRL 7.0 (GLOVE) IMPLANT
GLOVE BIOGEL PI IND STRL 7.5 (GLOVE) ×2 IMPLANT
GLOVE BIOGEL PI IND STRL 8 (GLOVE) ×2 IMPLANT
GLOVE BIOGEL PI INDICATOR 7.0 (GLOVE) ×2
GLOVE BIOGEL PI INDICATOR 7.5 (GLOVE) ×2
GLOVE BIOGEL PI INDICATOR 8 (GLOVE) ×6
GOWN STRL REUS W/ TWL LRG LVL3 (GOWN DISPOSABLE) ×4 IMPLANT
GOWN STRL REUS W/ TWL XL LVL3 (GOWN DISPOSABLE) ×2 IMPLANT
GOWN STRL REUS W/TWL LRG LVL3 (GOWN DISPOSABLE) ×8
GOWN STRL REUS W/TWL XL LVL3 (GOWN DISPOSABLE) ×4
KIT BASIN OR (CUSTOM PROCEDURE TRAY) ×4 IMPLANT
KIT ROOM TURNOVER OR (KITS) ×4 IMPLANT
MANIFOLD NEPTUNE II (INSTRUMENTS) ×2 IMPLANT
NEEDLE 22X1 1/2 (OR ONLY) (NEEDLE) IMPLANT
NS IRRIG 1000ML POUR BTL (IV SOLUTION) ×4 IMPLANT
PACK ORTHO EXTREMITY (CUSTOM PROCEDURE TRAY) ×4 IMPLANT
PAD ARMBOARD 7.5X6 YLW CONV (MISCELLANEOUS) ×8 IMPLANT
PAD CAST 4YDX4 CTTN HI CHSV (CAST SUPPLIES) ×2 IMPLANT
PADDING CAST COTTON 4X4 STRL (CAST SUPPLIES) ×8
PADDING CAST COTTON 6X4 STRL (CAST SUPPLIES) ×4 IMPLANT
PIN CLAMP 2BAR 75MM BLUE (EXFIX) ×4 IMPLANT
PIN HALF YELLOW 5X160X35 (EXFIX) ×4 IMPLANT
PIN TRANSFIXING 5.0 (EXFIX) ×2 IMPLANT
SET CYSTO W/LG BORE CLAMP LF (SET/KITS/TRAYS/PACK) ×2 IMPLANT
SOAP 2 % CHG 4 OZ (WOUND CARE) ×4 IMPLANT
SPLINT PLASTER CAST XFAST 5X30 (CAST SUPPLIES) IMPLANT
SPLINT PLASTER XFAST SET 5X30 (CAST SUPPLIES) ×8
STAPLER VISISTAT 35W (STAPLE) IMPLANT
SUCTION FRAZIER HANDLE 10FR (MISCELLANEOUS) ×2
SUCTION TUBE FRAZIER 10FR DISP (MISCELLANEOUS) ×2 IMPLANT
SUT ETHILON 3 0 PS 1 (SUTURE) ×4 IMPLANT
SUT PDS AB 0 CT 36 (SUTURE) ×2 IMPLANT
SUT PROLENE 3 0 PS 2 (SUTURE) ×2 IMPLANT
SUT VIC AB 2-0 CT1 27 (SUTURE)
SUT VIC AB 2-0 CT1 TAPERPNT 27 (SUTURE) ×4 IMPLANT
SUT VIC AB 3-0 PS2 18 (SUTURE)
SUT VIC AB 3-0 PS2 18XBRD (SUTURE) ×2 IMPLANT
SYR CONTROL 10ML LL (SYRINGE) IMPLANT
TOWEL OR 17X24 6PK STRL BLUE (TOWEL DISPOSABLE) ×4 IMPLANT
TOWEL OR 17X26 10 PK STRL BLUE (TOWEL DISPOSABLE) ×4 IMPLANT
TUBE CONNECTING 12'X1/4 (SUCTIONS) ×1
TUBE CONNECTING 12X1/4 (SUCTIONS) ×3 IMPLANT
WATER STERILE IRR 1000ML POUR (IV SOLUTION) ×2 IMPLANT
WIRE K 1.6MM 144256 (MISCELLANEOUS) ×10 IMPLANT

## 2016-02-16 NOTE — Anesthesia Postprocedure Evaluation (Signed)
Anesthesia Post Note  Patient: Priscilla MaduroSusan Liburd  Procedure(s) Performed: Procedure(s) (LRB): I&D RIGHT FOOT/CLOSE REDUCTION METATARSAL FX/LIGAMENT REPAIR/ CLOSED REDUCTION LEFT PILON FRACTURE (Right) EXTERNAL FIXATION LEG, left ankle (Left)  Patient location during evaluation: PACU Anesthesia Type: General Level of consciousness: awake and alert Pain management: pain level controlled Vital Signs Assessment: post-procedure vital signs reviewed and stable Respiratory status: spontaneous breathing, nonlabored ventilation, respiratory function stable and patient connected to nasal cannula oxygen Cardiovascular status: blood pressure returned to baseline and stable Postop Assessment: no signs of nausea or vomiting Anesthetic complications: no    Last Vitals:  Vitals:   02/16/16 1700 02/16/16 1715  BP: 134/85   Pulse: 98 91  Resp: 16 12  Temp: 36.7 C     Last Pain:  Vitals:   02/16/16 1636  TempSrc:   PainSc: 8                  Jayshaun Phillips DAVID

## 2016-02-16 NOTE — Anesthesia Procedure Notes (Addendum)
Procedure Name: Intubation Date/Time: 02/16/2016 1:46 PM Performed by: Fransisca KaufmannMEYER, Briona Korpela E Pre-anesthesia Checklist: Patient identified, Emergency Drugs available, Suction available and Patient being monitored Patient Re-evaluated:Patient Re-evaluated prior to inductionOxygen Delivery Method: Circle System Utilized Preoxygenation: Pre-oxygenation with 100% oxygen Intubation Type: IV induction Ventilation: Mask ventilation without difficulty Laryngoscope Size: Miller and 2 Grade View: Grade I Tube type: Oral Tube size: 7.5 mm Number of attempts: 1 Airway Equipment and Method: Oral airway (oral airway/soft/4x4s and tape) Placement Confirmation: ETT inserted through vocal cords under direct vision,  positive ETCO2 and breath sounds checked- equal and bilateral Tube secured with: Tape Dental Injury: Teeth and Oropharynx as per pre-operative assessment

## 2016-02-16 NOTE — Interval H&P Note (Signed)
History and Physical Interval Note:  02/16/2016 1:18 PM  Priscilla MaduroSusan Maybee  has presented today for surgery, with the diagnosis of OPEN FRACTURE DISLOCATION RIGHT FOOT and left tibial pilon fracture.  The various methods of treatment have been discussed with the patient and family. After consideration of risks, benefits and other options for treatment, the patient has consented to  Procedure(s): I&D RIGHT FOOT/CLOSE REDUCTION METATARSAL FX/LIGAMENT REPAIR/ CLOSED REDUCTION LEFT PILON FRACTURE (Right) as well as closed reduction and application of an external fixator to the left ankle as a surgical intervention .  The patient's history has been reviewed, patient examined, no change in status, stable for surgery.  I have reviewed the patient's chart and labs.  Questions were answered to the patient's satisfaction.    The risks and benefits of the alternative treatment options have been discussed in detail.  The patient wishes to proceed with surgery and specifically understands risks of bleeding, infection, nerve damage, blood clots, need for additional surgery, amputation and death.  Toni ArthursHEWITT, Ferd Horrigan

## 2016-02-16 NOTE — Anesthesia Preprocedure Evaluation (Signed)
Anesthesia Evaluation  Patient identified by MRN, date of birth, ID band Patient awake    Reviewed: Allergy & Precautions, NPO status , Patient's Chart, lab work & pertinent test results  Airway Mallampati: I  TM Distance: >3 FB Neck ROM: Full    Dental   Pulmonary    Pulmonary exam normal        Cardiovascular Normal cardiovascular exam     Neuro/Psych    GI/Hepatic   Endo/Other    Renal/GU      Musculoskeletal   Abdominal   Peds  Hematology   Anesthesia Other Findings   Reproductive/Obstetrics                             Anesthesia Physical Anesthesia Plan  ASA: II  Anesthesia Plan: General   Post-op Pain Management:  Regional for Post-op pain   Induction: Intravenous  Airway Management Planned: LMA  Additional Equipment:   Intra-op Plan:   Post-operative Plan: Extubation in OR  Informed Consent: I have reviewed the patients History and Physical, chart, labs and discussed the procedure including the risks, benefits and alternatives for the proposed anesthesia with the patient or authorized representative who has indicated his/her understanding and acceptance.     Plan Discussed with: CRNA and Surgeon  Anesthesia Plan Comments:         Anesthesia Quick Evaluation  
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
## 2016-02-16 NOTE — Progress Notes (Signed)
Not[facility] PT[facility].  Unfortunately, pt has no insurance, and does not have qualifying diagnosis for home therapies.  Will have [facility] evaluate for assistance with recommended DME through charity assistance.    Will continue to follow.    Quintella BatonJulie W. Jaynia Fendley, RN, BSN  Trauma/Neuro ICU Case Manager [phone]

## 2016-02-16 NOTE — Discharge Instructions (Signed)
Toni ArthursJohn Hewitt, MD [facility]  Please read the following information regarding your care after surgery.  Medications  You only need a prescription for the narcotic pain medicine (ex. oxycodone, Percocet, Norco).  All of the other medicines listed below are available over the counter. X acetominophen (Tylenol) 650 mg every 4-6 hours as you need for minor pain X oxycodone as prescribed for moderate to severe pain ?   Narcotic pain medicine (ex. oxycodone, Percocet, Vicodin) will cause constipation.  To prevent this problem, take the following medicines while you are taking any pain medicine. X docusate sodium (Colace) 100 mg twice a day X senna (Senokot) 2 tablets twice a day  X To help prevent blood clots, take a baby aspirin (81 mg) twice a day after surgery until you are instructed to initiate weightbearing.  You should also get up every hour while you are awake to move around.    Weight Bearing X Do not bear any weight on the operated leg or foot.  Cast / Splint / Dressing X Keep your splint or cast clean and dry.  Dont put anything (coat hanger, pencil, etc) down inside of it.  If it gets damp, use a hair dryer on the cool setting to dry it.  If it gets soaked, call the office to schedule an appointment for a cast change.  After your dressing, cast or splint is removed; you may shower, but do not soak or scrub the wound.  Allow the water to run over it, and then gently pat it dry.  Swelling It is normal for you to have swelling where you had surgery.  To reduce swelling and pain, keep your toes above your nose for at least 3 days after surgery.  It may be necessary to keep your foot or leg elevated for several weeks.  If it hurts, it should be elevated.  Follow Up Call my office at [phone] when you are discharged from the hospital or surgery center to schedule an appointment to be seen two weeks after surgery.  Call my office at [phone] if you develop a fever  >101.5 F, nausea, vomiting, bleeding from the surgical site or severe pain.

## 2016-02-16 NOTE — Brief Op Note (Signed)
02/13/2016 - 02/16/2016  3:44 PM  PATIENT:  Priscilla MaduroSusan Jacobs  40 y.o. female  PRE-OPERATIVE DIAGNOSIS:  1.  Open dislocation of right 1st MTP joint 2.  Right 2nd, 3rd and 4th metatarsal fractures 3.  Left tibial pilon fracture 4.  Left fibula fracture   POST-OPERATIVE DIAGNOSIS:  1. Open dislocation of right 1st MPT joint with complete rupture of the medial collateral ligament 2.  Right 2nd, 3rd and 4th metatarsal fractures 3.  Left tibial pilon fracture 4.  Left fibula fracture  Procedure(s): 1.  Irrigation and debridement of open right 1st MTP dislocation 2.  Repair of 1st MTP joint medial collateral ligament 3.  Stress exam of right foot under fluoro 4.  AP, oblique and lateral xrays of the right foot 5.  Closed treatment of left tibial pilon and fibula fractures with manipulation 6.  Application of multiplane external fixator 7.  AP and lateral xrays of the left ankle  SURGEON:  Toni ArthursJohn Louie Flenner, MD  ASSISTANT:  Alfredo MartinezJustin Ollis, PA-C  ANESTHESIA:   General, regional  EBL:  minimal   TOURNIQUET:   Total Tourniquet Time Documented: Calf (Right) - 49 minutes Total: Calf (Right) - 49 minutes  COMPLICATIONS:  None apparent  DISPOSITION:  Extubated, awake and stable to recovery.  DICTATION ID:  [id number]

## 2016-02-16 NOTE — Anesthesia Procedure Notes (Signed)
Anesthesia Regional Block:  Popliteal block  Pre-Anesthetic Checklist: ,, timeout performed, Correct Patient, Correct Site, Correct Laterality, Correct Procedure, Correct Position, site marked, Risks and benefits discussed,  Surgical consent,  Pre-op evaluation,  At surgeon's request and post-op pain management  Laterality: Right  Prep: chloraprep       Needles:  Injection technique: Single-shot  Needle Type: Echogenic Stimulator Needle     Needle Length: 10cm 10 cm Needle Gauge: 21 G    Additional Needles:  Procedures: ultrasound guided (picture in chart) and nerve stimulator Popliteal block  Nerve Stimulator or Paresthesia:  Response: 0.4 mA,   Additional Responses:   Narrative:  Start time: 02/16/2016 4:00 PM End time: 02/16/2016 4:00 PM Injection made incrementally with aspirations every 5 mL.  Performed by: Personally  Anesthesiologist: Arta BruceSSEY, Felicita Nuncio  Additional Notes: Monitors applied. Patient sedated. Sterile prep and drape,hand hygiene and sterile gloves were used. Relevant anatomy identified.Needle position confirmed.Local anesthetic injected incrementally after negative aspiration. Local anesthetic spread visualized around nerve(s). Vascular puncture avoided. No complications. Image printed for medical record.The patient tolerated the procedure well.  Additional Saphenous nerve block performed. 15cc Local Anesthetic mixture placed under ultrasonic guidance along the medio-inferior border of the Sartorious muscle 6 inches above the knee.  No Problems encountered.  Arta BruceKevin Jacquelyne Quarry MD

## 2016-02-16 NOTE — Transfer of Care (Signed)
Immediate Anesthesia Transfer of Care Note  Patient: Molly MaduroSusan Kuiken  Procedure(s) Performed: Procedure(s): I&D RIGHT FOOT/CLOSE REDUCTION METATARSAL FX/LIGAMENT REPAIR/ CLOSED REDUCTION LEFT PILON FRACTURE (Right) EXTERNAL FIXATION LEG, left ankle (Left)  Patient Location: PACU  Anesthesia Type:General and Regional  Level of Consciousness: awake, alert , oriented and pateint uncooperative   Airway & Oxygen Therapy: Patient Spontanous Breathing and Patient connected to face mask oxygen  Post-op Assessment: Report given to RN and Post -op Vital signs reviewed and stable  Post vital signs: Reviewed and stable  Last Vitals:  Vitals:   02/16/16 1320 02/16/16 1325  BP: (!) 139/55 (!) 139/55  Pulse: 99 91  Resp: 15 19  Temp:      Last Pain:  Vitals:   02/16/16 1325  TempSrc:   PainSc: 0-No pain      Patients Stated Pain Goal: 2 (02/16/16 1012)  Complications: No apparent anesthesia complications
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
## 2016-02-17 ENCOUNTER — Other Ambulatory Visit: Payer: Self-pay | Admitting: Orthopedic Surgery

## 2016-02-17 ENCOUNTER — Encounter (HOSPITAL_COMMUNITY): Payer: Self-pay | Admitting: Orthopedic Surgery

## 2016-02-17 LAB — CBC
HCT: 30.9 % — ABNORMAL LOW (ref 36.0–46.0)
HEMOGLOBIN: 9.6 g/dL — AB (ref 12.0–15.0)
MCH: 27.6 pg (ref 26.0–34.0)
MCHC: 31.1 g/dL (ref 30.0–36.0)
MCV: 88.8 fL (ref 78.0–100.0)
PLATELETS: 321 10*3/uL (ref 150–400)
RBC: 3.48 MIL/uL — AB (ref 3.87–5.11)
RDW: 13.6 % (ref 11.5–15.5)
WBC: 8.1 10*3/uL (ref 4.0–10.5)

## 2016-02-17 LAB — BLOOD PRODUCT ORDER (VERBAL) VERIFICATION

## 2016-02-17 MED ORDER — TRAMADOL HCL 50 MG PO TABS
100.0000 mg | ORAL_TABLET | Freq: Four times a day (QID) | ORAL | Status: DC
  Administered 2016-02-17 – 2016-02-22 (×20): 100 mg via ORAL
  Filled 2016-02-17 (×20): qty 2

## 2016-02-17 MED ORDER — FLUCONAZOLE 100 MG PO TABS
100.0000 mg | ORAL_TABLET | Freq: Once | ORAL | Status: AC
  Administered 2016-02-17: 100 mg via ORAL
  Filled 2016-02-17: qty 1
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
## 2016-02-17 NOTE — Progress Notes (Signed)
Subjective: 1 Day Post-Op Procedure(s) (LRB): I&D RIGHT FOOT/CLOSE REDUCTION METATARSAL FX/LIGAMENT REPAIR/ CLOSED REDUCTION LEFT PILON FRACTURE (Right) EXTERNAL FIXATION LEG, left ankle (Left)  Patient reports pain as moderate.  Tolerating POs well.  Denies fever, chills, N/V.  Accompanied by her entire immediate family.  Objective:   VITALS:  Temp:  [97 F (36.1 C)-98.9 F (37.2 C)] 97.3 F (36.3 C) (09/13 0930) Pulse Rate:  [74-119] 90 (09/13 0930) Resp:  [11-22] 18 (09/13 0930) BP: (112-153)/(55-92) 125/69 (09/13 0930) SpO2:  [93 %-100 %] 93 % (09/13 0930)  General: WDWN patient in NAD. Psych:  Appropriate mood and affect. Neuro:  A&O x 3, Moving all extremities, sensation intact to light touch HEENT:  EOMs intact Chest:  Even non-labored respirations Skin:  Dressing, pins, ex-fib C/D/I, no rashes or lesions Extremities: warm/dry, moderate edema, no erythema or echymosis.  No lymphadenopathy. Pulses: Popliteus 2+ MSK:  ROM: EHL/FHL intact, MMT: patient is able to perform quad set    LABS  Recent Labs  02/16/16 0536 02/17/16 0323  HGB 9.5* 9.6*  WBC 6.6 8.1  PLT 283 321   No results for input(s): NA, K, CL, CO2, BUN, CREATININE, GLUCOSE in the last 72 hours. No results for input(s): LABPT, INR in the last 72 hours.   Assessment/Plan: 1 Day Post-Op Procedure(s) (LRB): I&D RIGHT FOOT/CLOSE REDUCTION METATARSAL FX/LIGAMENT REPAIR/ CLOSED REDUCTION LEFT PILON FRACTURE (Right) EXTERNAL FIXATION LEG, left ankle (Left)  NWB Bilateral LE Plan for L ex-fix removal and ORIF of tibial pilon and fibula fxs on Saturday vs Tuesday by Dr. Toni ArthursJohn Hewitt. Continue Lovenox for DVT prophylaxis   Alfredo MartinezJustin Jezel Basto, PA-C, ATC [facility] Office:  [phone]
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
## 2016-02-18 DIAGNOSIS — S92901S Unspecified fracture of right foot, sequela: Secondary | ICD-10-CM

## 2016-02-18 DIAGNOSIS — F0781 Postconcussional syndrome: Secondary | ICD-10-CM

## 2016-02-18 LAB — GLUCOSE, CAPILLARY: GLUCOSE-CAPILLARY: 107 mg/dL — AB (ref 65–99)

## 2016-02-18 MED ORDER — OXYCODONE HCL ER 10 MG PO T12A
10.0000 mg | EXTENDED_RELEASE_TABLET | Freq: Two times a day (BID) | ORAL | Status: DC
  Administered 2016-02-18 – 2016-02-21 (×7): 10 mg via ORAL
  Filled 2016-02-18 (×7): qty 1

## 2016-02-18 MED ORDER — MECLIZINE HCL 12.5 MG PO TABS
12.5000 mg | ORAL_TABLET | Freq: Two times a day (BID) | ORAL | Status: DC
  Administered 2016-02-18 – 2016-02-22 (×8): 12.5 mg via ORAL
  Filled 2016-02-18 (×10): qty 1

## 2016-02-18 MED ORDER — POLYETHYLENE GLYCOL 3350 17 G PO PACK
17.0000 g | PACK | Freq: Two times a day (BID) | ORAL | Status: DC
  Administered 2016-02-18 – 2016-02-22 (×7): 17 g via ORAL
  Filled 2016-02-18 (×8): qty 1

## 2016-02-18 NOTE — Progress Notes (Signed)
Inpatient Rehabilitation  Meet with patient and spouse to discuss team's recommendation for IP Rehab.  Shared booklets and answered questions.  Spouse reports that modifications to home such as a ramped entrance and bathroom accessibility are underway.  They are interested in our rehab program.  Plan to follow along for timing of medical readiness and bed availability.  Please call with questions.   Charlane FerrettiMelissa Rome Schlauch, M.A., CCC/SLP Admission Coordinator  [facility]  Cell [phone]
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
## 2016-02-18 NOTE — Progress Notes (Signed)
Subjective: 2 Days Post-Op Procedure(s) (LRB): I&D RIGHT FOOT/CLOSE REDUCTION METATARSAL FX/LIGAMENT REPAIR/ CLOSED REDUCTION LEFT PILON FRACTURE (Right) EXTERNAL FIXATION LEG, left ankle (Left)  Patient reports pain as mild to moderate.  Tolerating POs well.  Denies BM, however admits to flatulence.  Denies fever, chills, N/V.  Objective:   VITALS:  Temp:  [97.5 F (36.4 C)-98.8 F (37.1 C)] 97.5 F (36.4 C) (09/14 1109) Pulse Rate:  [68-93] 68 (09/14 1109) Resp:  [18-20] 18 (09/14 1109) BP: (108-149)/(64-90) 136/78 (09/14 1109) SpO2:  [92 %-99 %] 92 % (09/14 1109)  General: WDWN patient in NAD. Psych:  Appropriate mood and affect. Neuro:  A&O x 3, Moving all extremities, sensation intact to light touch HEENT:  EOMs intact Chest:  Even non-labored respirations Skin:  Dressings/splints/ex-fix C/D/I, no rashes or lesions Extremities: warm/dry, mild edema, no erythema or echymosis.  No lymphadenopathy. Pulses: Popliteus 2+ MSK:  ROM: EHL/FHL intact bilaterally, MMT: patient is able to perform quad set    LABS  Recent Labs  02/16/16 0536 02/17/16 0323  HGB 9.5* 9.6*  WBC 6.6 8.1  PLT 283 321   No results for input(s): NA, K, CL, CO2, BUN, CREATININE, GLUCOSE in the last 72 hours. No results for input(s): LABPT, INR in the last 72 hours.   Assessment/Plan: 2 Days Post-Op Procedure(s) (LRB): I&D RIGHT FOOT/CLOSE REDUCTION METATARSAL FX/LIGAMENT REPAIR/ CLOSED REDUCTION LEFT PILON FRACTURE (Right) EXTERNAL FIXATION LEG, left ankle (Left)  NWB Bilateral LE Plan for removal of of ex-fix and ORIF of L tibial pilon and lateral malleolus fxs by Dr. Toni ArthursJohn Hewitt on 02/20/16 at 9:00am.   Alfredo MartinezJustin Linnet Bottari, PA-C, ATC [facility] Office:  [phone]
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
## 2016-02-19 LAB — GLUCOSE, CAPILLARY: GLUCOSE-CAPILLARY: 118 mg/dL — AB (ref 65–99)

## 2016-02-19 MED ORDER — SODIUM CHLORIDE 0.9 % IV SOLN
INTRAVENOUS | Status: DC
  Administered 2016-02-20: 01:00:00 via INTRAVENOUS

## 2016-02-19 MED ORDER — DEXTROSE-NACL 5-0.9 % IV SOLN
INTRAVENOUS | Status: DC
  Administered 2016-02-19 – 2016-02-20 (×2): via INTRAVENOUS

## 2016-02-19 MED ORDER — ENOXAPARIN SODIUM 30 MG/0.3ML ~~LOC~~ SOLN
30.0000 mg | Freq: Two times a day (BID) | SUBCUTANEOUS | Status: AC
  Administered 2016-02-19: 30 mg via SUBCUTANEOUS
  Filled 2016-02-19: qty 0.3

## 2016-02-19 MED ORDER — CHLORHEXIDINE GLUCONATE 4 % EX LIQD: 60.0000 mL | Freq: Once | CUTANEOUS | Status: DC

## 2016-02-19 MED ORDER — CEFAZOLIN SODIUM-DEXTROSE 2-4 GM/100ML-% IV SOLN
2.0000 g | INTRAVENOUS | Status: DC
  Filled 2016-02-19 (×2): qty 100

## 2016-02-19 NOTE — PMR Pre-admission (Signed)
PMR Admission Coordinator Pre-Admission Assessment  Patient: Priscilla MaduroSusan Suire is an 40 y.o., female MRN: [id number] DOB: 02/26/1976 Height: 4\' 11"  (149.9 cm) Weight: 111.1 kg (245 lb)              Insurance Information HMO:     PPO:      PCP:      IPA:      80/20:      OTHER:  PRIMARY:    Uninsured, self pay   Policy#:       Subscriber:  CM Name:       Phone#:      Fax#:  Pre-Cert#:       Employer:  Benefits:  Phone #:      Name:  Eff. Date:      Deduct:       Out of Pocket Max:       Life Max:  CIR:       SNF:  Outpatient:      Co-Pay:  Home Health:       Co-Pay:  DME:      Co-Pay:  Providers:   Medicaid Application Date:       Case Manager:  Disability Application Date:       Case Worker:   Emergency Contact Information Contact Information    Name Relation Home Work Mobile   Lafata,Roy Spouse   [phone]     Current Medical History  Patient Admitting Diagnosis: Polytrauma with post-concussion/PTSD symptoms  History of Present Illness: Polytrauma with post-concussion/PTSD symptoms      Past Medical History  History reviewed. No pertinent past medical history.  Family History  family history is not on file.  Prior Rehab/Hospitalizations:  Has the patient had major surgery during 100 days prior to admission? No  Current Medications   Current Facility-Administered Medications:  .  acetaminophen (TYLENOL) tablet 650 mg, 650 mg, Oral, Q6H PRN, 650 mg at 02/15/16 1056 **OR** acetaminophen (TYLENOL) suppository 650 mg, 650 mg, Rectal, Q6H PRN, Manus RuddMatthew Tsuei, MD .  docusate sodium (COLACE) capsule 200 mg, 200 mg, Oral, BID, Freeman CaldronMichael J Jeffery, PA-C, 100 mg at 02/22/16 0958 .  enoxaparin (LOVENOX) injection 40 mg, 40 mg, Subcutaneous, Q24H, [address]ike [place]llis, PA-C, 40 mg at 02/22/16 0959 .  ketorolac (TORADOL) 30 MG/ML injection 30 mg, 30 mg, Intravenous, Q6H PRN, Romie LeveeAlicia Thomas, MD, 30 mg at 02/21/16 2359 .  meclizine (ANTIVERT) tablet 12.5 mg, 12.5 mg, Oral, BID, Freeman CaldronMichael J  Jeffery, PA-C, 12.5 mg at 02/22/16 0959 .  metoCLOPramide (REGLAN) tablet 5-10 mg, 5-10 mg, Oral, Q8H PRN **OR** metoCLOPramide (REGLAN) injection 5-10 mg, 5-10 mg, Intravenous, Q8H PRN, [employer] EnergyJustin Pike Ollis, PA-C, 10 mg at 02/20/16 1300 .  morphine 2 MG/ML injection 2 mg, 2 mg, Intravenous, Q4H PRN, Freeman CaldronMichael J Jeffery, PA-C, 2 mg at 02/22/16 0209 .  neomycin-bacitracin-polymyxin (NEOSPORIN) ointment, , Topical, Daily, Emelia LoronMatthew Wakefield, MD .  ondansetron [facility](ZOFRAN) tablet 4 mg, 4 mg, Oral, Q6H PRN **OR** ondansetron (ZOFRAN) injection 4 mg, 4 mg, Intravenous, Q6H PRN, [employer] EnergyJustin Pike Ollis, PA-C, 4 mg at 02/21/16 1417 .  oxyCODONE (Oxy IR/ROXICODONE) immediate release tablet 10-20 mg, 10-20 mg, Oral, Q4H PRN, Freeman CaldronMichael J Jeffery, PA-C, 20 mg at 02/22/16 0647 .  oxyCODONE (OXYCONTIN) 12 hr tablet 20 mg, 20 mg, Oral, Q12H, Freeman CaldronMichael J Jeffery, PA-C, 20 mg at 02/22/16 0959 .  polyethylene glycol (MIRALAX / GLYCOLAX) packet 17 g, 17 g, Oral, BID, Freeman CaldronMichael J Jeffery, PA-C, 17 g at 02/22/16 1000 .  traMADol (ULTRAM) tablet  100 mg, 100 mg, Oral, Q6H, Freeman Caldron, PA-C, 100 mg at 02/22/16 1231  Patients Current Diet: Diet regular Room service appropriate? 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    Home Assistive Devices / Equipment Home Assistive Devices/Equipment: None Home Equipment: None  Prior Device Use: Indicate devices/aids used by the patient prior to current illness, exacerbation or injury? None of the  above  Prior Functional Level Prior Function Level of Independence: Independent  Self Care: Did the patient need help bathing, dressing, using the toilet or eating?  Independent  Indoor Mobility: Did the patient need assistance with walking from room to room (with or without device)? Independent  Stairs: Did the patient need assistance with internal or external stairs (with or without device)? Independent  Functional Cognition: Did the patient need help planning regular tasks such as shopping or remembering to take medications? Independent  Current Functional Level Cognition  Overall Cognitive Status: Within Functional Limits for tasks assessed Orientation Level: Oriented X4    Extremity Assessment (includes Sensation/Coordination)  Upper Extremity Assessment: Overall WFL for tasks assessed  Lower Extremity Assessment:  (total assist needed with moving bilateral LEs)    ADLs  Overall ADL's : Needs assistance/impaired Eating/Feeding: Set up, Bed level Grooming: Oral care, Sitting, Set up Grooming Details (indicate cue type and reason): seated in recliner Upper Body Bathing: Moderate assistance, Sitting Upper Body Bathing Details (indicate cue type and reason): seated in recliner Lower Body Bathing: Total assistance, Bed level Lower Body Bathing Details (indicate cue type and reason): Poor - Fair sitting balance Upper Body Dressing : Moderate assistance Upper Body Dressing Details (indicate cue type and reason): seated in recliner Lower Body Dressing: Total assistance Lower Body Dressing Details (indicate cue type and reason): B LEs in soft splints awaiting further surgery Toilet Transfer: Minimal assistance, Anterior/posterior Toilet Transfer Details (indicate cue type and reason): simulated to chair Toileting- Clothing Manipulation and Hygiene: Total assistance, Bed level Functional mobility during ADLs: Maximal assistance, +2 for physical assistance (to sit EOB and return to  supine and to position to [facility]) General ADL Comments: Instructed to leaning side to side when donning pants or to perform pericare.    Mobility  Overal bed mobility: Needs Assistance Bed Mobility: Supine to Sit Supine to sit: Min guard Sit to supine: +2 for physical assistance, Max assist General bed mobility comments: HOB up, min guard for long sitting in bed, min guard and increased time to pivot and  perform AP transfer    Transfers  Overall transfer level: Needs assistance Equipment used: None Transfers: Counselling psychologist transfers: Min assist General transfer comment: Pt using bilateral UEs to assist with transfer. +2 mod assist with fully scooting back in chair.     Ambulation / Gait / Stairs / Engineer, drilling / Balance Dynamic Sitting Balance Sitting balance - Comments: Pt initially with Poor sitting balance and was able to balance self with min - min guard A after sitting EOB a few minutes Balance  Overall balance assessment: Needs assistance Sitting-balance support: Bilateral upper extremity supported Sitting balance-Leahy Scale: Fair Sitting balance - Comments: Pt initially with Poor sitting balance and was able to balance self with min - min guard A after sitting EOB a few minutes Standing balance comment: NWB B LEs, no standing allowed at this time    Special needs/care consideration BiPAP/CPAP: No CPM: No Continuous Drip IV  no Dialysis: No         Life Vest: No Oxygen: No Special Bed: No Trach Size: No Wound Vac (area): No       Skin  Yeast/moisture associated skin break down around groin                               Bowel mgmt: Continent with bedpan  Bladder mgmt: Continent , last BM 02/21/16 Diabetic mgmt: No     Previous Home Environment Living Arrangements: Spouse/significant other, Children Available Help at Discharge: Family Type of Home: House Home Layout: One level Home Access: Stairs to  enter Entrance Stairs-Rails: Right Entrance Stairs-Number of Steps: 4 Bathroom Shower/Tub: Hydrographic surveyor, Health visitor: Standard Home Care Services: No Additional Comments: spoke with pt's husband on phone about home needs and he stated that they will be installing a ramp and remodeling bathroom to have walk in shower, raised toilet seat and wider doorway for w/c  entry  Discharge Living Setting Plans for Discharge Living Setting: Patient's home Type of Home at Discharge: House Discharge Home Layout: One level Discharge Home Access: Stairs to enter (spouse reports a ramp is being built) Entrance Stairs-Rails: None Secretary/administrator of Steps: 3 Discharge Bathroom Shower/Tub: Walk-in shower (being modified ) Discharge Bathroom Toilet: Standard Discharge Bathroom Accessibility: Yes How Accessible: Other (comment) (reports that they are widening for a wheelchair ) Does the patient have any problems obtaining your medications?: Yes (Describe) (financial difficulty )  Social/Family/Support Systems Patient Roles: Spouse, Parent Anticipated Caregiver: Spouse and son  Anticipated Caregiver's Contact Information: Spouse: Jabier Gauss [phone] Ability/Limitations of Caregiver: spouse works 1st shift and son will work 3rd shift to help Caregiver Availability: 24/7 Discharge Plan Discussed with Primary Caregiver: Yes Is Caregiver In Agreement with Plan?: Yes Does Caregiver/Family have Issues with Lodging/Transportation while Pt is in Rehab?: No  Goals/Additional Needs Patient/Family Goal for Rehab: OT Min-Mod A; PT Min A; SLP Mod I  Expected length of stay: 15-20 days  Cultural Considerations: None Dietary Needs: Patient reports being hypoglycemic  Equipment Needs: TBD Special Service Needs: None Additional Information: None Pt/Family Agrees to Admission and willing to participate: Yes Program Orientation Provided & Reviewed with Pt/Caregiver Including Roles  &  Responsibilities: Yes Additional Information Needs: Patient requested to speak with a finincial counselor  Information Needs to be Provided By: AC/CSW   Decrease burden of Care through IP rehab admission: No  Possible need for SNF placement upon discharge: No  Patient Condition: This patient's medical and functional status has changed since the consult dated: 02/18/16 in which the Rehabilitation Physician determined and documented that the patient's condition is appropriate for intensive rehabilitative care in an inpatient rehabilitation facility. See "History of Present Illness" (above) for medical update. Functional changes are: Pt. Is completing min and mod assist transfers (A/P) to chair and for simulated toilet transfers.   Patient's medical and functional status update has been discussed with the Rehabilitation physician and patient remains appropriate for inpatient rehabilitation. Will admit to inpatient rehab today.  Preadmission Screen Completed By:  [name], 02/22/2016 2:02 PM ______________________________________________________________________   Discussed status with Dr.  Allena Katz on 02/22/16 at  1401 and received telephone approval for admission today.  Admission Coordinator:  NAJAE, FILSAIME, time 1610 /Date 02/22/16
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
## 2016-02-19 NOTE — Progress Notes (Signed)
Subjective: 3 Days Post-Op Procedure(s) (LRB): I&D RIGHT FOOT/CLOSE REDUCTION METATARSAL FX/LIGAMENT REPAIR/ CLOSED REDUCTION LEFT PILON FRACTURE (Right) EXTERNAL FIXATION LEG, left ankle (Left)  Patient reports pain as mild to moderate.  Tolerating POs well.  Denies fever, chills, N/V.  Denies BM, however admits to flatulence.  She is hoping to be D/C'd to CIR.  Objective:   VITALS:  Temp:  [97 F (36.1 C)-98.1 F (36.7 C)] 97.6 F (36.4 C) (09/15 0534) Pulse Rate:  [68-85] 79 (09/15 0534) Resp:  [16-20] 18 (09/15 0534) BP: (123-145)/(58-92) 124/58 (09/15 0534) SpO2:  [92 %-95 %] 94 % (09/15 0534)  General: WDWN patient in NAD. Psych:  Appropriate mood and affect. Neuro:  A&O x 3, Moving all extremities, sensation intact to light touch HEENT:  EOMs intact Chest:  Even non-labored respirations Skin:  Dressings/splints/ex-fix C/D/I, no rashes or lesions Extremities: warm/dry, mild edema, no erythema or echymosis.  No lymphadenopathy. Pulses: Popliteus 2+ MSK:  ROM: EHL/FHL intact, MMT: patient is able to perform quad set bilaterally    LABS  Recent Labs  02/17/16 0323  HGB 9.6*  WBC 8.1  PLT 321   No results for input(s): NA, K, CL, CO2, BUN, CREATININE, GLUCOSE in the last 72 hours. No results for input(s): LABPT, INR in the last 72 hours.   Assessment/Plan: 3 Days Post-Op Procedure(s) (LRB): I&D RIGHT FOOT/CLOSE REDUCTION METATARSAL FX/LIGAMENT REPAIR/ CLOSED REDUCTION LEFT PILON FRACTURE (Right) EXTERNAL FIXATION LEG, left ankle (Left)  To surgery tomorrow morning for ex-fix removal and ORIF L tibial pilon and lateral malleolus fxs by Dr. Toni ArthursJohn Hewitt NPO after midnight Hold blood thinner after noon. NWB bilateral LEs  Alfredo MartinezJustin Syniyah Bourne, AlamoPA-C, ATC [facility] Office:  [phone]
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
## 2016-02-19 NOTE — Progress Notes (Signed)
Inpatient Rehabilitation  Continuing to follow for hopeful IP Rehab admission 9/18, pending bed availability, following plans for surgery on 02/20/16 for ex-fix removal.  Plan for my co-worker Weldon PickingSusan Blankenship to follow up Monday.    Charlane FerrettiMelissa Hallee Mckenny, M.A., CCC/SLP Admission Coordinator  [facility]  Cell [phone]

## 2016-02-20 ENCOUNTER — Encounter (HOSPITAL_COMMUNITY): Admission: EM | Disposition: A | Discharge status: Rehabilitation Facility | Payer: Self-pay | Source: Home / Self Care

## 2016-02-20 ENCOUNTER — Encounter (HOSPITAL_COMMUNITY): Payer: Self-pay | Admitting: Anesthesiology

## 2016-02-20 ENCOUNTER — Inpatient Hospital Stay (HOSPITAL_COMMUNITY): Payer: Medicaid Other | Admitting: Anesthesiology

## 2016-02-20 ENCOUNTER — Inpatient Hospital Stay (HOSPITAL_COMMUNITY): Payer: Medicaid Other

## 2016-02-20 HISTORY — PX: EXTERNAL FIXATION REMOVAL: SHX5040

## 2016-02-20 HISTORY — PX: OPEN REDUCTION INTERNAL FIXATION (ORIF) TIBIA/FIBULA FRACTURE: SHX5992

## 2016-02-20 LAB — CREATININE, SERUM
CREATININE: 0.57 mg/dL (ref 0.44–1.00)
GFR calc non Af Amer: >60 mL/min (ref >60)

## 2016-02-20 SURGERY — REMOVAL, EXTERNAL FIXATION DEVICE, LOWER EXTREMITY
Anesthesia: General | Site: Leg Lower | Laterality: Left

## 2016-02-20 MED ORDER — HYDROMORPHONE HCL 1 MG/ML IJ SOLN: 0.2500 mg | INTRAMUSCULAR | Status: DC | PRN

## 2016-02-20 MED ORDER — PROPOFOL 10 MG/ML IV BOLUS
INTRAVENOUS | Status: AC
  Filled 2016-02-20: qty 20

## 2016-02-20 MED ORDER — ONDANSETRON HCL 4 MG/2ML IJ SOLN
INTRAMUSCULAR | Status: AC
  Filled 2016-02-20: qty 2

## 2016-02-20 MED ORDER — ENOXAPARIN SODIUM 40 MG/0.4ML ~~LOC~~ SOLN
40.0000 mg | SUBCUTANEOUS | Status: DC
  Administered 2016-02-21 – 2016-02-22 (×2): 40 mg via SUBCUTANEOUS
  Filled 2016-02-20 (×2): qty 0.4

## 2016-02-20 MED ORDER — LIDOCAINE 2% (20 MG/ML) 5 ML SYRINGE
INTRAMUSCULAR | Status: DC | PRN
  Administered 2016-02-20: 80 mg via INTRAVENOUS

## 2016-02-20 MED ORDER — PROPOFOL 10 MG/ML IV BOLUS
INTRAVENOUS | Status: DC | PRN
  Administered 2016-02-20: 150 mg via INTRAVENOUS

## 2016-02-20 MED ORDER — MIDAZOLAM HCL 2 MG/2ML IJ SOLN
INTRAMUSCULAR | Status: AC
  Filled 2016-02-20: qty 2

## 2016-02-20 MED ORDER — MIDAZOLAM HCL 5 MG/5ML IJ SOLN
INTRAMUSCULAR | Status: DC | PRN
  Administered 2016-02-20: 2 mg via INTRAVENOUS

## 2016-02-20 MED ORDER — KETOROLAC TROMETHAMINE 30 MG/ML IJ SOLN: 30.0000 mg | Freq: Once | INTRAMUSCULAR | Status: DC | PRN

## 2016-02-20 MED ORDER — DEXAMETHASONE SODIUM PHOSPHATE 10 MG/ML IJ SOLN
INTRAMUSCULAR | Status: DC | PRN
  Administered 2016-02-20: 5 mg via INTRAVENOUS

## 2016-02-20 MED ORDER — SUCCINYLCHOLINE CHLORIDE 20 MG/ML IJ SOLN
INTRAMUSCULAR | Status: DC | PRN
  Administered 2016-02-20: 120 mg via INTRAVENOUS

## 2016-02-20 MED ORDER — FENTANYL CITRATE (PF) 100 MCG/2ML IJ SOLN
INTRAMUSCULAR | Status: AC
  Filled 2016-02-20: qty 2

## 2016-02-20 MED ORDER — BUPIVACAINE-EPINEPHRINE (PF) 0.5% -1:200000 IJ SOLN
INTRAMUSCULAR | Status: DC | PRN
  Administered 2016-02-20: 30 mL via PERINEURAL

## 2016-02-20 MED ORDER — 0.9 % SODIUM CHLORIDE (POUR BTL) OPTIME
TOPICAL | Status: DC | PRN
  Administered 2016-02-20: 1000 mL

## 2016-02-20 MED ORDER — LACTATED RINGERS IV SOLN
INTRAVENOUS | Status: DC
  Administered 2016-02-20: 09:00:00 via INTRAVENOUS

## 2016-02-20 MED ORDER — LIDOCAINE 2% (20 MG/ML) 5 ML SYRINGE
INTRAMUSCULAR | Status: AC
  Filled 2016-02-20: qty 5

## 2016-02-20 MED ORDER — CEFAZOLIN SODIUM-DEXTROSE 2-4 GM/100ML-% IV SOLN
2.0000 g | INTRAVENOUS | Status: AC
  Administered 2016-02-20: 2 g via INTRAVENOUS
  Filled 2016-02-20: qty 100

## 2016-02-20 MED ORDER — FENTANYL CITRATE (PF) 100 MCG/2ML IJ SOLN
INTRAMUSCULAR | Status: DC | PRN
  Administered 2016-02-20 (×2): 50 ug via INTRAVENOUS
  Administered 2016-02-20: 25 ug via INTRAVENOUS
  Administered 2016-02-20: 50 ug via INTRAVENOUS
  Administered 2016-02-20: 25 ug via INTRAVENOUS

## 2016-02-20 MED ORDER — ONDANSETRON HCL 4 MG/2ML IJ SOLN
INTRAMUSCULAR | Status: DC | PRN
  Administered 2016-02-20: 4 mg via INTRAVENOUS

## 2016-02-20 MED ORDER — PROMETHAZINE HCL 25 MG/ML IJ SOLN: 6.2500 mg | INTRAMUSCULAR | Status: DC | PRN

## 2016-02-20 SURGICAL SUPPLY — 96 items
BANDAGE ACE 4X5 VEL STRL LF (GAUZE/BANDAGES/DRESSINGS) ×2 IMPLANT
BANDAGE ACE 6X5 VEL STRL LF (GAUZE/BANDAGES/DRESSINGS) ×2 IMPLANT
BANDAGE ESMARK 6X9 LF (GAUZE/BANDAGES/DRESSINGS) ×1 IMPLANT
BIT DRILL 2.0 (BIT) ×2
BIT DRILL 2.0MM (BIT) ×1
BIT DRILL 2.5X2.75 QC CALB (BIT) ×2 IMPLANT
BIT DRILL 2XNS DISP SS SM FRAG (BIT) IMPLANT
BIT DRILL CALIBRATED 2.7 (BIT) ×1 IMPLANT
BIT DRILL CALIBRATED 2.7MM (BIT) ×1
BIT DRL 2XNS DISP SS SM FRAG (BIT) ×1
BLADE SURG 15 STRL LF DISP TIS (BLADE) ×1 IMPLANT
BLADE SURG 15 STRL SS (BLADE) ×3
BNDG CMPR 9X6 STRL LF SNTH (GAUZE/BANDAGES/DRESSINGS) ×1
BNDG COHESIVE 4X5 TAN STRL (GAUZE/BANDAGES/DRESSINGS) ×1 IMPLANT
BNDG COHESIVE 6X5 TAN STRL LF (GAUZE/BANDAGES/DRESSINGS) ×3 IMPLANT
BNDG ESMARK 6X9 LF (GAUZE/BANDAGES/DRESSINGS) ×3
CANISTER SUCT 3000ML PPV (MISCELLANEOUS) ×3 IMPLANT
CHLORAPREP W/TINT 26ML (MISCELLANEOUS) ×5 IMPLANT
COVER SURGICAL LIGHT HANDLE (MISCELLANEOUS) ×3 IMPLANT
CUFF TOURNIQUET SINGLE 34IN LL (TOURNIQUET CUFF) ×1 IMPLANT
CUFF TOURNIQUET SINGLE 44IN (TOURNIQUET CUFF) ×2 IMPLANT
DRAPE C-ARMOR (DRAPES) IMPLANT
DRAPE INCISE IOBAN 66X45 STRL (DRAPES) ×1 IMPLANT
DRAPE OEC MINIVIEW 54X84 (DRAPES) ×3 IMPLANT
DRAPE ORTHO SPLIT 77X108 STRL (DRAPES)
DRAPE SURG ORHT 6 SPLT 77X108 (DRAPES) IMPLANT
DRAPE U-SHAPE 47X51 STRL (DRAPES) ×3 IMPLANT
DRILL SLEEVE 2.7 DIST TIB (TRAUMA) ×2
DRSG ADAPTIC 3X8 NADH LF (GAUZE/BANDAGES/DRESSINGS) IMPLANT
DRSG MEPILEX BORDER 4X4 (GAUZE/BANDAGES/DRESSINGS) ×1 IMPLANT
DRSG MEPITEL 4X7.2 (GAUZE/BANDAGES/DRESSINGS) ×3 IMPLANT
DRSG PAD ABDOMINAL 8X10 ST (GAUZE/BANDAGES/DRESSINGS) ×2 IMPLANT
ELECT REM PT RETURN 9FT ADLT (ELECTROSURGICAL) ×3
ELECTRODE REM PT RTRN 9FT ADLT (ELECTROSURGICAL) ×1 IMPLANT
GAUZE SPONGE 4X4 12PLY STRL (GAUZE/BANDAGES/DRESSINGS) ×2 IMPLANT
GLOVE BIO SURGEON STRL SZ8 (GLOVE) ×3 IMPLANT
GLOVE BIOGEL PI IND STRL 8 (GLOVE) ×2 IMPLANT
GLOVE BIOGEL PI INDICATOR 8 (GLOVE) ×10
GLOVE ECLIPSE 7.5 STRL STRAW (GLOVE) ×4 IMPLANT
GOWN STRL REUS W/ TWL LRG LVL3 (GOWN DISPOSABLE) ×1 IMPLANT
GOWN STRL REUS W/ TWL XL LVL3 (GOWN DISPOSABLE) ×2 IMPLANT
GOWN STRL REUS W/TWL LRG LVL3 (GOWN DISPOSABLE) ×3
GOWN STRL REUS W/TWL XL LVL3 (GOWN DISPOSABLE) ×6
K-WIRE ACE 1.6X6 (WIRE) ×12
KIT BASIN OR (CUSTOM PROCEDURE TRAY) ×3 IMPLANT
KIT ROOM TURNOVER OR (KITS) ×3 IMPLANT
KWIRE ACE 1.6X6 (WIRE) IMPLANT
MANIFOLD NEPTUNE II (INSTRUMENTS) ×3 IMPLANT
NEEDLE 22X1 1/2 (OR ONLY) (NEEDLE) IMPLANT
NS IRRIG 1000ML POUR BTL (IV SOLUTION) ×3 IMPLANT
PACK ORTHO EXTREMITY (CUSTOM PROCEDURE TRAY) ×3 IMPLANT
PAD ABD 8X10 STRL (GAUZE/BANDAGES/DRESSINGS) ×2 IMPLANT
PAD ARMBOARD 7.5X6 YLW CONV (MISCELLANEOUS) ×3 IMPLANT
PAD CAST 4YDX4 CTTN HI CHSV (CAST SUPPLIES) ×1 IMPLANT
PADDING CAST ABS 4INX4YD NS (CAST SUPPLIES) ×2
PADDING CAST ABS 6INX4YD NS (CAST SUPPLIES) ×2
PADDING CAST ABS COTTON 4X4 ST (CAST SUPPLIES) IMPLANT
PADDING CAST ABS COTTON 6X4 NS (CAST SUPPLIES) IMPLANT
PADDING CAST COTTON 4X4 STRL (CAST SUPPLIES)
PLATE 6H LT DIST ANTLAT TIB (Plate) ×2 IMPLANT
PLATE ACE 100DE 10H (Plate) ×2 IMPLANT
SCREW CORT FT 32X3.5XNONLOCK (Screw) IMPLANT
SCREW CORTICAL 2.7MM  20MM (Screw) ×2 IMPLANT
SCREW CORTICAL 2.7MM 20MM (Screw) IMPLANT
SCREW CORTICAL 3.5MM  12MM (Screw) ×2 IMPLANT
SCREW CORTICAL 3.5MM  28MM (Screw) ×2 IMPLANT
SCREW CORTICAL 3.5MM  32MM (Screw) ×2 IMPLANT
SCREW CORTICAL 3.5MM 12MM (Screw) IMPLANT
SCREW CORTICAL 3.5MM 14MM (Screw) ×10 IMPLANT
SCREW CORTICAL 3.5MM 28MM (Screw) IMPLANT
SCREW CORTICAL 3.5MM 32MM (Screw) ×1 IMPLANT
SCREW LOCK CORT STAR 3.5X24 (Screw) ×2 IMPLANT
SCREW LOCK CORT STAR 3.5X28 (Screw) ×4 IMPLANT
SCREW LOCK CORT STAR 3.5X34 (Screw) ×6 IMPLANT
SCREW LOCK CORT STAR 3.5X36 (Screw) ×2 IMPLANT
SLEEVE DRILL 2.7 DIST TIB (TRAUMA) IMPLANT
SPLINT PLASTER CAST XFAST 5X30 (CAST SUPPLIES) IMPLANT
SPLINT PLASTER XFAST SET 5X30 (CAST SUPPLIES) ×2
SPONGE LAP 18X18 X RAY DECT (DISPOSABLE) ×3 IMPLANT
STAPLER VISISTAT 35W (STAPLE) IMPLANT
SUCTION FRAZIER HANDLE 10FR (MISCELLANEOUS) ×2
SUCTION TUBE FRAZIER 10FR DISP (MISCELLANEOUS) ×1 IMPLANT
SUT ETHILON 2 0 FS 18 (SUTURE) IMPLANT
SUT ETHILON 3 0 PS 1 (SUTURE) ×1 IMPLANT
SUT ETHILON 4 0 PS 2 18 (SUTURE) ×6 IMPLANT
SUT MNCRL AB 3-0 PS2 18 (SUTURE) ×7 IMPLANT
SUT VIC AB 0 CT1 27 (SUTURE) ×9
SUT VIC AB 0 CT1 27XBRD ANBCTR (SUTURE) IMPLANT
SUT VIC AB 2-0 FS1 27 (SUTURE) ×1 IMPLANT
SYR CONTROL 10ML LL (SYRINGE) IMPLANT
TAP CORT 3.5MM SOLID DISP (TRAUMA) ×2 IMPLANT
TOWEL OR 17X24 6PK STRL BLUE (TOWEL DISPOSABLE) ×3 IMPLANT
TOWEL OR 17X26 10 PK STRL BLUE (TOWEL DISPOSABLE) ×3 IMPLANT
TUBE CONNECTING 12'X1/4 (SUCTIONS) ×1
TUBE CONNECTING 12X1/4 (SUCTIONS) ×2 IMPLANT
UNDERPAD 30X30 (UNDERPADS AND DIAPERS) ×5 IMPLANT

## 2016-02-20 NOTE — Transfer of Care (Signed)
Immediate Anesthesia Transfer of Care Note  Patient: Priscilla Jacobs  Procedure(s) Performed: Procedure(s): REMOVAL EXTERNAL FIXATION LEG (Left) OPEN REDUCTION INTERNAL FIXATION (ORIF) TIBIA/FIBULA FRACTURE (Left)  Patient Location: PACU  Anesthesia Type:GA combined with regional for post-op pain  Level of Consciousness: sedated  Airway & Oxygen Therapy: Patient Spontanous Breathing and Patient connected to nasal cannula oxygen  Post-op Assessment: Report given to RN and Post -op Vital signs reviewed and stable  Post vital signs: Reviewed and stable  Last Vitals:  Vitals:   02/19/16 2159 02/20/16 0504  BP: 128/61 115/76  Pulse: 81 75  Resp: 16 16  Temp: 36.9 C 36.7 C    Last Pain:  Vitals:   02/20/16 0504  TempSrc: Oral  PainSc:       Patients Stated Pain Goal: 0 (02/20/16 0135)  Complications: No apparent anesthesia complications

## 2016-02-20 NOTE — Anesthesia Procedure Notes (Signed)
Procedure Name: Intubation Date/Time: 02/20/2016 9:28 AM Performed by: Quentin OreWALKER, Stepheni Cameron E Pre-anesthesia Checklist: Patient identified, Emergency Drugs available, Suction available and Patient being monitored Patient Re-evaluated:Patient Re-evaluated prior to inductionOxygen Delivery Method: Circle system utilized Preoxygenation: Pre-oxygenation with 100% oxygen Intubation Type: IV induction Ventilation: Mask ventilation without difficulty Laryngoscope Size: Mac and 3 Grade View: Grade I Tube type: Oral Tube size: 7.0 mm Number of attempts: 1 Airway Equipment and Method: Stylet Placement Confirmation: ETT inserted through vocal cords under direct vision,  positive ETCO2 and breath sounds checked- equal and bilateral Secured at: 21 cm Tube secured with: Tape Dental Injury: Teeth and Oropharynx as per pre-operative assessment
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
## 2016-02-20 NOTE — Anesthesia Preprocedure Evaluation (Addendum)
Anesthesia Evaluation  Patient identified by MRN, date of birth, ID band Patient awake    Reviewed: Allergy & Precautions, NPO status , Patient's Chart, lab work & pertinent test results  Airway Mallampati: II  TM Distance: <3 FB Neck ROM: Full    Dental no notable dental hx. (+) Teeth Intact   Pulmonary neg pulmonary ROS,    Pulmonary exam normal breath sounds clear to auscultation       Cardiovascular negative cardio ROS Normal cardiovascular exam Rhythm:Regular Rate:Normal     Neuro/Psych negative neurological ROS  negative psych ROS   GI/Hepatic negative GI ROS, Neg liver ROS,   Endo/Other  Morbid obesity  Renal/GU negative Renal ROS  negative genitourinary   Musculoskeletal negative musculoskeletal ROS (+)   Abdominal (+) + obese,   Peds negative pediatric ROS (+)  Hematology negative hematology ROS (+)   Anesthesia Other Findings   Reproductive/Obstetrics negative OB ROS                            Anesthesia Physical Anesthesia Plan  ASA: II  Anesthesia Plan: General   Post-op Pain Management:    Induction: Intravenous  Airway Management Planned: Oral ETT  Additional Equipment:   Intra-op Plan:   Post-operative Plan: Extubation in OR  Informed Consent: I have reviewed the patients History and Physical, chart, labs and discussed the procedure including the risks, benefits and alternatives for the proposed anesthesia with the patient or authorized representative who has indicated his/her understanding and acceptance.   Dental advisory given  Plan Discussed with: CRNA and Surgeon  Anesthesia Plan Comments:         Anesthesia Quick Evaluation
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
## 2016-02-20 NOTE — Interval H&P Note (Signed)
History and Physical Interval Note:  02/20/2016 9:04 AM  Priscilla MaduroSusan Jacobs  has presented today for surgery, with the diagnosis of left tibal pilon and fibula fracture s/p external fixator  The various methods of treatment have been discussed with the patient and family. After consideration of risks, benefits and other options for treatment, the patient has consented to  Procedure(s): REMOVAL EXTERNAL FIXATION LEG (Left) OPEN REDUCTION INTERNAL FIXATION (ORIF) TIBIA/FIBULA FRACTURE (Left) as a surgical intervention .  The patient's history has been reviewed, patient examined, no change in status, stable for surgery.  I have reviewed the patient's chart and labs.  Questions were answered to the patient's satisfaction.    The risks and benefits of the alternative treatment options have been discussed in detail.  The patient wishes to proceed with surgery and specifically understands risks of bleeding, infection, nerve damage, blood clots, need for additional surgery, amputation and death.   Toni ArthursHEWITT, Maxene Byington

## 2016-02-20 NOTE — Anesthesia Procedure Notes (Signed)
Anesthesia Regional Block:  Popliteal block  Pre-Anesthetic Checklist: ,, timeout performed, Correct Patient, Correct Site, Correct Laterality, Correct Procedure, Correct Position, site marked, Risks and benefits discussed,  Surgical consent,  Pre-op evaluation,  At surgeon's request and post-op pain management  Laterality: Left  Prep: chloraprep       Needles:  Injection technique: Single-shot  Needle Type: Echogenic Needle     Needle Length: 9cm 9 cm Needle Gauge: 21 G    Additional Needles:  Procedures: ultrasound guided (picture in chart) Popliteal block Narrative:  Injection made incrementally with aspirations every 5 mL.  Performed by: Personally  Anesthesiologist: Trammell Bowden  Additional Notes: Patient tolerated the procedure well without complications      
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
## 2016-02-20 NOTE — Anesthesia Postprocedure Evaluation (Signed)
Anesthesia Post Note  Patient: Priscilla MaduroSusan Jacobs  Procedure(s) Performed: Procedure(s) (LRB): REMOVAL EXTERNAL FIXATION LEG (Left) OPEN REDUCTION INTERNAL FIXATION (ORIF) TIBIA/FIBULA FRACTURE (Left)  Patient location during evaluation: PACU Anesthesia Type: General and Regional Level of consciousness: awake and alert Pain management: pain level controlled Vital Signs Assessment: post-procedure vital signs reviewed and stable Respiratory status: spontaneous breathing, nonlabored ventilation, respiratory function stable and patient connected to nasal cannula oxygen Cardiovascular status: blood pressure returned to baseline and stable Postop Assessment: no signs of nausea or vomiting Anesthetic complications: no    Last Vitals:  Vitals:   02/20/16 1206 02/20/16 1221  BP: 139/76 (!) 142/80  Pulse: 81 90  Resp: 16 15  Temp:  36.8 C    Last Pain:  Vitals:   02/20/16 0504  TempSrc: Oral  PainSc:                  [name] S

## 2016-02-21 MED ORDER — KETOROLAC TROMETHAMINE 30 MG/ML IJ SOLN
30.0000 mg | Freq: Four times a day (QID) | INTRAMUSCULAR | Status: DC | PRN
  Administered 2016-02-21 (×3): 30 mg via INTRAVENOUS
  Filled 2016-02-21 (×3): qty 1
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
## 2016-02-21 NOTE — Progress Notes (Signed)
Subjective: 1 Day Post-Op Procedure(s) (LRB): REMOVAL EXTERNAL FIXATION LEG (Left) OPEN REDUCTION INTERNAL FIXATION (ORIF) TIBIA/FIBULA FRACTURE (Left) Patient reports pain as moderate.  tolerating PO's. Reports a poor night do to pain. Her iv failed and she went without morphine until it was replaced.  Denies SOb, CP, N/V, or F/C.  Objective: Vital signs in last 24 hours: Temp:  [98.1 F (36.7 C)-98.3 F (36.8 C)] 98.3 F (36.8 C) (09/17 0840) Pulse Rate:  [78-90] 78 (09/17 0840) Resp:  [14-17] 17 (09/17 0840) BP: (116-145)/(48-96) 116/58 (09/17 0840) SpO2:  [91 %-97 %] 92 % (09/17 0840)  Intake/Output from previous day: 09/16 0701 - 09/17 0700 In: 1090 [P.O.:240; I.V.:850] Out: 1400 [Urine:1350; Blood:50] Intake/Output this shift: No intake/output data recorded.  No results for input(s): HGB in the last 72 hours. No results for input(s): WBC, RBC, HCT, PLT in the last 72 hours.  Recent Labs  02/20/16 0531  CREATININE 0.57   No results for input(s): LABPT, INR in the last 72 hours.  Alert and oriented x3. RRR, Lungs clear, BS x4.  L & R leg dressing/ Splints C/D/I. No DVT signs. No signs of infection or compartment syndrome. BLE grossly neurovascularly intact.   Assessment/Plan: 1 Day Post-Op Procedure(s) (LRB): REMOVAL EXTERNAL FIXATION LEG (Left) OPEN REDUCTION INTERNAL FIXATION (ORIF) TIBIA/FIBULA FRACTURE (Left) Advance diet NWB PT On Lovenox Splints WNL Family at bedside Continue current care Consult for inpatient rehab  D/c Monday Markham JordanSTILWELL, Karna Abed L 02/21/2016, 9:27 AM

## 2016-02-22 ENCOUNTER — Inpatient Hospital Stay (HOSPITAL_COMMUNITY)
Admission: RE | Admit: 2016-02-22 | Discharge: 2016-03-05 | DRG: 560 | Disposition: A | Discharge status: Home / Self Care | Payer: Medicaid Other | Source: Intra-hospital | Attending: Physical Medicine & Rehabilitation | Admitting: Physical Medicine & Rehabilitation

## 2016-02-22 ENCOUNTER — Encounter (HOSPITAL_COMMUNITY): Payer: Self-pay | Admitting: Orthopedic Surgery

## 2016-02-22 ENCOUNTER — Encounter: Payer: Self-pay | Admitting: Neurology

## 2016-02-22 ENCOUNTER — Inpatient Hospital Stay (HOSPITAL_COMMUNITY)
Admission: RE | Admit: 2016-02-22 | Payer: Self-pay | Source: Intra-hospital | Admitting: Physical Medicine & Rehabilitation

## 2016-02-22 DIAGNOSIS — Z419 Encounter for procedure for purposes other than remedying health state, unspecified: Secondary | ICD-10-CM

## 2016-02-22 DIAGNOSIS — S92311S Displaced fracture of first metatarsal bone, right foot, sequela: Secondary | ICD-10-CM

## 2016-02-22 DIAGNOSIS — S92412B Displaced fracture of proximal phalanx of left great toe, initial encounter for open fracture: Secondary | ICD-10-CM | POA: Diagnosis present

## 2016-02-22 DIAGNOSIS — K5903 Drug induced constipation: Secondary | ICD-10-CM | POA: Diagnosis present

## 2016-02-22 DIAGNOSIS — G8918 Other acute postprocedural pain: Secondary | ICD-10-CM | POA: Diagnosis present

## 2016-02-22 DIAGNOSIS — Z6841 Body Mass Index (BMI) 40.0 and over, adult: Secondary | ICD-10-CM | POA: Diagnosis not present

## 2016-02-22 DIAGNOSIS — S92311D Displaced fracture of first metatarsal bone, right foot, subsequent encounter for fracture with routine healing: Secondary | ICD-10-CM

## 2016-02-22 DIAGNOSIS — S82202D Unspecified fracture of shaft of left tibia, subsequent encounter for closed fracture with routine healing: Secondary | ICD-10-CM | POA: Diagnosis not present

## 2016-02-22 DIAGNOSIS — T1490 Injury, unspecified: Secondary | ICD-10-CM

## 2016-02-22 DIAGNOSIS — S92412D Displaced fracture of proximal phalanx of left great toe, subsequent encounter for fracture with routine healing: Secondary | ICD-10-CM | POA: Diagnosis not present

## 2016-02-22 DIAGNOSIS — S92301A Fracture of unspecified metatarsal bone(s), right foot, initial encounter for closed fracture: Secondary | ICD-10-CM

## 2016-02-22 DIAGNOSIS — D62 Acute posthemorrhagic anemia: Secondary | ICD-10-CM

## 2016-02-22 DIAGNOSIS — S92415S Nondisplaced fracture of proximal phalanx of left great toe, sequela: Secondary | ICD-10-CM

## 2016-02-22 DIAGNOSIS — S82402D Unspecified fracture of shaft of left fibula, subsequent encounter for closed fracture with routine healing: Secondary | ICD-10-CM | POA: Diagnosis not present

## 2016-02-22 DIAGNOSIS — F0781 Postconcussional syndrome: Secondary | ICD-10-CM | POA: Diagnosis present

## 2016-02-22 DIAGNOSIS — Z79899 Other long term (current) drug therapy: Secondary | ICD-10-CM | POA: Diagnosis not present

## 2016-02-22 DIAGNOSIS — F4311 Post-traumatic stress disorder, acute: Secondary | ICD-10-CM

## 2016-02-22 DIAGNOSIS — R269 Unspecified abnormalities of gait and mobility: Secondary | ICD-10-CM | POA: Diagnosis present

## 2016-02-22 DIAGNOSIS — H8109 Meniere's disease, unspecified ear: Secondary | ICD-10-CM | POA: Diagnosis present

## 2016-02-22 DIAGNOSIS — T1490XA Injury, unspecified, initial encounter: Secondary | ICD-10-CM

## 2016-02-22 DIAGNOSIS — E46 Unspecified protein-calorie malnutrition: Secondary | ICD-10-CM | POA: Diagnosis not present

## 2016-02-22 DIAGNOSIS — M62838 Other muscle spasm: Secondary | ICD-10-CM

## 2016-02-22 DIAGNOSIS — S36892D Contusion of other intra-abdominal organs, subsequent encounter: Secondary | ICD-10-CM | POA: Diagnosis not present

## 2016-02-22 DIAGNOSIS — S36892A Contusion of other intra-abdominal organs, initial encounter: Secondary | ICD-10-CM | POA: Diagnosis present

## 2016-02-22 DIAGNOSIS — S060X9A Concussion with loss of consciousness of unspecified duration, initial encounter: Secondary | ICD-10-CM

## 2016-02-22 DIAGNOSIS — M62831 Muscle spasm of calf: Secondary | ICD-10-CM | POA: Diagnosis not present

## 2016-02-22 DIAGNOSIS — F4321 Adjustment disorder with depressed mood: Secondary | ICD-10-CM

## 2016-02-22 DIAGNOSIS — S92901A Unspecified fracture of right foot, initial encounter for closed fracture: Secondary | ICD-10-CM | POA: Diagnosis present

## 2016-02-22 DIAGNOSIS — S36899S Unspecified injury of other intra-abdominal organs, sequela: Secondary | ICD-10-CM

## 2016-02-22 DIAGNOSIS — E8809 Other disorders of plasma-protein metabolism, not elsewhere classified: Secondary | ICD-10-CM

## 2016-02-22 DIAGNOSIS — S82402A Unspecified fracture of shaft of left fibula, initial encounter for closed fracture: Secondary | ICD-10-CM

## 2016-02-22 DIAGNOSIS — S8292XS Unspecified fracture of left lower leg, sequela: Secondary | ICD-10-CM

## 2016-02-22 DIAGNOSIS — S92301S Fracture of unspecified metatarsal bone(s), right foot, sequela: Secondary | ICD-10-CM

## 2016-02-22 DIAGNOSIS — S82202A Unspecified fracture of shaft of left tibia, initial encounter for closed fracture: Secondary | ICD-10-CM | POA: Diagnosis present

## 2016-02-22 LAB — CBC
HEMATOCRIT: 33.1 % — AB (ref 36.0–46.0)
HEMOGLOBIN: 10.3 g/dL — AB (ref 12.0–15.0)
MCH: 28 pg (ref 26.0–34.0)
MCHC: 31.1 g/dL (ref 30.0–36.0)
MCV: 89.9 fL (ref 78.0–100.0)
Platelets: 389 10*3/uL (ref 150–400)
RBC: 3.68 MIL/uL — AB (ref 3.87–5.11)
RDW: 13.9 % (ref 11.5–15.5)
WBC: 7.6 10*3/uL (ref 4.0–10.5)

## 2016-02-22 LAB — CREATININE, SERUM: Creatinine, Ser: 0.76 mg/dL (ref 0.44–1.00)

## 2016-02-22 MED ORDER — OXYCODONE HCL 5 MG PO TABS
10.0000 mg | ORAL_TABLET | ORAL | Status: DC | PRN
  Administered 2016-02-22 – 2016-02-23 (×2): 10 mg via ORAL
  Administered 2016-02-23 (×2): 20 mg via ORAL
  Administered 2016-02-24: 15 mg via ORAL
  Administered 2016-02-25 (×2): 20 mg via ORAL
  Administered 2016-02-26 – 2016-03-01 (×6): 10 mg via ORAL
  Administered 2016-03-02: 20 mg via ORAL
  Filled 2016-02-22: qty 4
  Filled 2016-02-22: qty 2
  Filled 2016-02-22 (×2): qty 4
  Filled 2016-02-22 (×4): qty 2
  Filled 2016-02-22: qty 3
  Filled 2016-02-22: qty 2
  Filled 2016-02-22: qty 4
  Filled 2016-02-22: qty 2
  Filled 2016-02-22 (×3): qty 4

## 2016-02-22 MED ORDER — MECLIZINE HCL 25 MG PO TABS
12.5000 mg | ORAL_TABLET | Freq: Two times a day (BID) | ORAL | Status: DC
  Administered 2016-02-22 – 2016-03-05 (×24): 12.5 mg via ORAL
  Filled 2016-02-22 (×25): qty 1

## 2016-02-22 MED ORDER — INFLUENZA VAC SPLIT QUAD 0.5 ML IM SUSY
0.5000 mL | PREFILLED_SYRINGE | INTRAMUSCULAR | Status: AC
  Administered 2016-02-23: 0.5 mL via INTRAMUSCULAR
  Filled 2016-02-22: qty 0.5

## 2016-02-22 MED ORDER — MAGNESIUM CITRATE PO SOLN
1.0000 | Freq: Once | ORAL | Status: AC
  Administered 2016-02-22: 1 via ORAL
  Filled 2016-02-22: qty 296

## 2016-02-22 MED ORDER — SORBITOL 70 % SOLN
30.0000 mL | Freq: Every day | Status: DC | PRN
  Administered 2016-02-23 (×2): 30 mL via ORAL
  Filled 2016-02-22 (×2): qty 30

## 2016-02-22 MED ORDER — ENOXAPARIN SODIUM 40 MG/0.4ML ~~LOC~~ SOLN: 40.0000 mg | SUBCUTANEOUS | Status: DC

## 2016-02-22 MED ORDER — PNEUMOCOCCAL VAC POLYVALENT 25 MCG/0.5ML IJ INJ
0.5000 mL | INJECTION | INTRAMUSCULAR | Status: AC
  Administered 2016-02-23: 0.5 mL via INTRAMUSCULAR
  Filled 2016-02-22: qty 0.5

## 2016-02-22 MED ORDER — ONDANSETRON HCL 4 MG/2ML IJ SOLN: 4.0000 mg | Freq: Four times a day (QID) | INTRAMUSCULAR | Status: DC | PRN

## 2016-02-22 MED ORDER — POLYETHYLENE GLYCOL 3350 17 G PO PACK
17.0000 g | PACK | Freq: Two times a day (BID) | ORAL | Status: DC
  Administered 2016-02-22 – 2016-03-04 (×18): 17 g via ORAL
  Filled 2016-02-22 (×22): qty 1

## 2016-02-22 MED ORDER — ACETAMINOPHEN 650 MG RE SUPP: 650.0000 mg | Freq: Four times a day (QID) | RECTAL | Status: DC | PRN

## 2016-02-22 MED ORDER — ACETAMINOPHEN 325 MG PO TABS
650.0000 mg | ORAL_TABLET | Freq: Four times a day (QID) | ORAL | Status: DC | PRN
  Administered 2016-02-24 – 2016-03-02 (×5): 650 mg via ORAL
  Filled 2016-02-22 (×5): qty 2

## 2016-02-22 MED ORDER — OXYCODONE HCL ER 10 MG PO T12A
20.0000 mg | EXTENDED_RELEASE_TABLET | Freq: Two times a day (BID) | ORAL | Status: DC
  Administered 2016-02-22: 20 mg via ORAL
  Filled 2016-02-22: qty 2

## 2016-02-22 MED ORDER — BACITRACIN-NEOMYCIN-POLYMYXIN OINTMENT TUBE
TOPICAL_OINTMENT | Freq: Every day | CUTANEOUS | Status: DC
  Administered 2016-02-23 – 2016-02-25 (×3): via TOPICAL
  Administered 2016-02-26: 1 via TOPICAL
  Administered 2016-02-27 – 2016-03-03 (×3): via TOPICAL
  Filled 2016-02-22: qty 15

## 2016-02-22 MED ORDER — OXYCODONE HCL ER 10 MG PO T12A
20.0000 mg | EXTENDED_RELEASE_TABLET | Freq: Two times a day (BID) | ORAL | Status: DC
  Administered 2016-02-22 – 2016-02-25 (×6): 20 mg via ORAL
  Filled 2016-02-22 (×6): qty 2

## 2016-02-22 MED ORDER — TRAMADOL HCL 50 MG PO TABS
100.0000 mg | ORAL_TABLET | Freq: Four times a day (QID) | ORAL | Status: DC
  Administered 2016-02-23 – 2016-03-01 (×27): 100 mg via ORAL
  Filled 2016-02-22 (×27): qty 2

## 2016-02-22 MED ORDER — DOCUSATE SODIUM 100 MG PO CAPS
200.0000 mg | ORAL_CAPSULE | Freq: Two times a day (BID) | ORAL | Status: DC
  Administered 2016-02-22 – 2016-02-23 (×2): 200 mg via ORAL
  Filled 2016-02-22 (×2): qty 2

## 2016-02-22 MED ORDER — ONDANSETRON HCL 4 MG PO TABS: 4.0000 mg | ORAL_TABLET | Freq: Four times a day (QID) | ORAL | Status: DC | PRN

## 2016-02-22 MED ORDER — ENOXAPARIN SODIUM 40 MG/0.4ML ~~LOC~~ SOLN
40.0000 mg | SUBCUTANEOUS | Status: DC
  Administered 2016-02-23 – 2016-03-05 (×12): 40 mg via SUBCUTANEOUS
  Filled 2016-02-22 (×12): qty 0.4
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
## 2016-02-22 NOTE — Progress Notes (Signed)
[name] Rehab Admission Coordinator Signed Physical Medicine and Rehabilitation  PMR Pre-admission Date of Service: 02/19/2016 3:49 PM  Related encounter: ED to Hosp-Admission (Discharged) from 02/13/2016 in [facility]       [] Hide copied text PMR Admission Coordinator Pre-Admission Assessment  Patient: Priscilla Jacobs is an 40 y.o., female MRN: [id number] DOB: 1975-07-22 Height: 4\' 11"  (149.9 cm) Weight: 111.1 kg (245 lb)                                                                                                                                                  Insurance Information HMO:     PPO:      PCP:      IPA:      80/20:      OTHER:  PRIMARY:    Uninsured, self pay   Policy#:       Subscriber:  CM Name:       Phone#:      Fax#:  Pre-Cert#:       Employer:  Benefits:  Phone #:      Name:  Eff. Date:      Deduct:       Out of Pocket Max:       Life Max:  CIR:       SNF:  Outpatient:      Co-Pay:  Home Health:       Co-Pay:  DME:      Co-Pay:  Providers:   Medicaid Application Date:       Case Manager:  Disability Application Date:       Case Worker:   Emergency Contact Information        Contact Information    Name Relation Home Work Mobile   Novack,Roy Spouse   [phone]     Current Medical History  Patient Admitting Diagnosis: Polytrauma with post-concussion/PTSD symptoms  History of Present Illness: Polytrauma with post-concussion/PTSD symptoms      Past Medical History  History reviewed. No pertinent past medical history.  Family History  family history is not on file.  Prior Rehab/Hospitalizations:  Has the patient had major surgery during 100 days prior to admission? No  Current Medications   Current Facility-Administered Medications:  .  acetaminophen (TYLENOL) tablet 650 mg, 650 mg, Oral, Q6H PRN, 650 mg at 02/15/16 1056 **OR** acetaminophen (TYLENOL) suppository 650 mg, 650 mg, Rectal, Q6H  PRN, Manus Rudd, MD .  docusate sodium (COLACE) capsule 200 mg, 200 mg, Oral, BID, Freeman Caldron, PA-C, 100 mg at 02/22/16 0958 .  enoxaparin (LOVENOX) injection 40 mg, 40 mg, Subcutaneous, Q24H, [address] [place], PA-C, 40 mg at 02/22/16 0959 .  ketorolac (TORADOL) 30 MG/ML injection 30 mg, 30 mg, Intravenous, Q6H PRN, Romie Levee, MD, 30 mg at 02/21/16 2359 .  meclizine (ANTIVERT) tablet 12.5 mg,  12.5 mg, Oral, BID, Freeman Caldron, PA-C, 12.5 mg at 02/22/16 1610 .  metoCLOPramide (REGLAN) tablet 5-10 mg, 5-10 mg, Oral, Q8H PRN **OR** metoCLOPramide (REGLAN) injection 5-10 mg, 5-10 mg, Intravenous, Q8H PRN, [employer], PA-C, 10 mg at 02/20/16 1300 .  morphine 2 MG/ML injection 2 mg, 2 mg, Intravenous, Q4H PRN, Freeman Caldron, PA-C, 2 mg at 02/22/16 0209 .  neomycin-bacitracin-polymyxin (NEOSPORIN) ointment, , Topical, Daily, Emelia Loron, MD .  ondansetron [facility]) tablet 4 mg, 4 mg, Oral, Q6H PRN **OR** ondansetron (ZOFRAN) injection 4 mg, 4 mg, Intravenous, Q6H PRN, [employer], PA-C, 4 mg at 02/21/16 1417 .  oxyCODONE (Oxy IR/ROXICODONE) immediate release tablet 10-20 mg, 10-20 mg, Oral, Q4H PRN, Freeman Caldron, PA-C, 20 mg at 02/22/16 0647 .  oxyCODONE (OXYCONTIN) 12 hr tablet 20 mg, 20 mg, Oral, Q12H, Freeman Caldron, PA-C, 20 mg at 02/22/16 0959 .  polyethylene glycol (MIRALAX / GLYCOLAX) packet 17 g, 17 g, Oral, BID, Freeman Caldron, PA-C, 17 g at 02/22/16 1000 .  traMADol (ULTRAM) tablet 100 mg, 100 mg, Oral, Q6H, Freeman Caldron, PA-C, 100 mg at 02/22/16 1231  Patients Current Diet: Diet regular Room service appropriate? 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    Home Assistive Devices / Equipment Home Assistive Devices/Equipment: None Home Equipment: None  Prior Device Use: Indicate devices/aids used by the patient prior to current illness, exacerbation or injury? None of the above  Prior Functional Level Prior Function Level of Independence: Independent  Self Care: Did the patient need help bathing, dressing, using the toilet or eating?  Independent  Indoor Mobility: Did the patient need assistance with walking from room to room (with or without device)? Independent  Stairs: Did the patient need assistance with internal or external stairs (with or without device)? Independent  Functional Cognition: Did the patient need help planning regular tasks such as shopping or remembering to take medications? Independent  Current Functional Level Cognition Overall Cognitive Status: Within Functional Limits for tasks assessed Orientation Level: Oriented X4    Extremity Assessment (includes Sensation/Coordination) Upper Extremity Assessment: Overall WFL for tasks assessed  Lower Extremity Assessment:  (total assist needed with moving bilateral LEs)   ADLs Overall ADL's : Needs assistance/impaired Eating/Feeding: Set up, Bed level Grooming: Oral care, Sitting, Set up Grooming Details (indicate cue type and reason): seated in recliner Upper Body Bathing: Moderate assistance, Sitting Upper Body Bathing Details (indicate cue type and reason): seated in recliner Lower Body Bathing: Total assistance, Bed level Lower Body Bathing Details (indicate cue type and reason): Poor - Fair sitting balance Upper Body Dressing : Moderate assistance Upper Body Dressing Details (indicate cue type and reason): seated in recliner Lower Body  Dressing: Total assistance Lower Body Dressing Details (indicate cue type and reason): B LEs in soft splints awaiting further surgery Toilet Transfer: Minimal assistance, Anterior/posterior Toilet Transfer Details (indicate cue type and reason): simulated to chair Toileting- Clothing Manipulation and Hygiene: Total assistance, Bed level Functional mobility during ADLs: Maximal  assistance, +2 for physical assistance (to sit EOB and return to supine and to position to [facility]) General ADL Comments: Instructed to leaning side to side when donning pants or to perform pericare.   Mobility Overal bed mobility: Needs Assistance Bed Mobility: Supine to Sit Supine to sit: Min guard Sit to supine: +2 for physical assistance, Max assist General bed mobility comments: HOB up, min guard for long sitting in bed, min guard and increased time to pivot and  perform AP transfer   Transfers Overall transfer level: Needs assistance Equipment used: None Transfers: Counselling psychologist transfers: Min assist General transfer comment: Pt using bilateral UEs to assist with transfer. +2 mod assist with fully scooting back in chair.    Ambulation / Gait / Stairs / Acupuncturist / Balance Dynamic Sitting Balance Sitting balance - Comments: Pt initially with Poor sitting balance and was able to balance self with min - min guard A after sitting EOB a few minutes Balance Overall balance assessment: Needs assistance Sitting-balance support: Bilateral upper extremity supported Sitting balance-Leahy Scale: Fair Sitting balance - Comments: Pt initially with Poor sitting balance and was able to balance self with min - min guard A after sitting EOB a few minutes Standing balance comment: NWB B LEs, no standing allowed at this time   Special needs/care consideration BiPAP/CPAP: No CPM: No Continuous Drip IV  no Dialysis: No         Life Vest: No Oxygen: No Special Bed: No Trach  Size: No Wound Vac (area): No       Skin  Yeast/moisture associated skin break down around groin                               Bowel mgmt: Continent with bedpan  Bladder mgmt: Continent , last BM 02/21/16 Diabetic mgmt: No    Previous Home Environment Living Arrangements: Spouse/significant other, Children Available Help at Discharge: Family Type of Home: House Home Layout: One level Home Access: Stairs to enter Entrance Stairs-Rails: Right Entrance Stairs-Number of Steps: 4 Bathroom Shower/Tub: Hydrographic surveyor, Health visitor: Standard Home Care Services: No Additional Comments: spoke with pt's husband on phone about home needs and he stated that they will be installing a ramp and remodeling bathroom to have walk in shower, raised toilet seat and wider doorway for w/c  entry  Discharge Living Setting Plans for Discharge Living Setting: Patient's home Type of Home at Discharge: House Discharge Home Layout: One level Discharge Home Access: Stairs to enter (spouse reports a ramp is being built) Entrance Stairs-Rails: None Secretary/administrator of Steps: 3 Discharge Bathroom Shower/Tub: Walk-in shower (being modified ) Discharge Bathroom Toilet: Standard Discharge Bathroom Accessibility: Yes How Accessible: Other (comment) (reports that they are widening for a wheelchair ) Does the patient have any problems obtaining your medications?: Yes (Describe) (financial difficulty )  Social/Family/Support Systems Patient Roles: Spouse, Parent Anticipated Caregiver: Spouse and son  Anticipated Industrial/product designer Information: Spouse: Jabier Gauss [phone] Ability/Limitations of Caregiver: spouse works 1st shift and son will work 3rd shift to help Caregiver Availability: 24/7 Discharge Plan Discussed with Primary Caregiver: Yes Is Caregiver In Agreement with Plan?: Yes Does Caregiver/Family have Issues with Lodging/Transportation while Pt is in Rehab?:  No  Goals/Additional Needs Patient/Family Goal for Rehab: OT Min-Mod A; PT Min A; SLP Mod I  Expected length of stay: 15-20 days  Cultural Considerations: None Dietary Needs: Patient  reports being hypoglycemic  Equipment Needs: TBD Special Service Needs: None Additional Information: None Pt/Family Agrees to Admission and willing to participate: Yes Program Orientation Provided & Reviewed with Pt/Caregiver Including Roles  & Responsibilities: Yes Additional Information Needs: Patient requested to speak with a finincial counselor  Information Needs to be Provided By: AC/CSW   Decrease burden of Care through IP rehab admission: No  Possible need for SNF placement upon discharge: No  Patient Condition: This patient's medical and functional status has changed since the consult dated: 02/18/16 in which the Rehabilitation Physician determined and documented that the patient's condition is appropriate for intensive rehabilitative care in an inpatient rehabilitation facility. See "History of Present Illness" (above) for medical update. Functional changes are: Pt. Is completing min and mod assist transfers (A/P) to chair and for simulated toilet transfers.   Patient's medical and functional status update has been discussed with the Rehabilitation physician and patient remains appropriate for inpatient rehabilitation. Will admit to inpatient rehab today.   Preadmission Screen Completed By:  Weldon PickingBLANKENSHIP, [place], 02/22/2016 2:02 PM ______________________________________________________________________   Discussed status with Dr.  Allena KatzPatel on 02/22/16 at  1401 and received telephone approval for admission today.  Admission Coordinator:  Weldon PickingBLANKENSHIP, [place], time [id number] /Date 02/22/16       Cosigned by: Ankit Karis JubaAnil Patel, MD at 02/22/2016 2:04 PM  Revision History

## 2016-02-22 NOTE — Progress Notes (Signed)
I have a bed for this patient today.  Priscilla Jacobs, trauma PA has cleared pt. For admission.  Pt. Is pleased and agreeable.  I have updated Priscilla Jacobs, RNCM as well as pt's RN of the plan.  Please call if questions.  Weldon PickingSusan Presleigh Jacobs PT Inpatient Rehab Admissions Coordinator Cell [phone] Office [phone]

## 2016-02-22 NOTE — Progress Notes (Signed)
Patient ID: [name], female   DOB: 22-Dec-1975, 40 y.o.   MRN: [id number] Patient admitted to [phone] via bed, escorted by nursing staff and family.  Patient and family verbalized understanding of rehab process, signed fall safety agreement.  Appears to be in no immediate distress at this time.  Dani Gobbleeardon, Elizaveta Mattice J, RN
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
## 2016-02-22 NOTE — Progress Notes (Signed)
Inpatient Rehabilitation  I await PT session and finalization of bed availability to determine if pt. able to come to CIR today if medically cleared.  Will provide an updated once bed availability is determined and pt. has completed her therapy session for tolerance of IP rehab program.   Please call if questions.  Weldon PickingSusan Erza [place] PT Inpatient Rehab Admissions Coordinator Cell [phone] Office [phone]

## 2016-02-22 NOTE — Progress Notes (Signed)
Discharged pt to Inpt rehab, room 18 via bed. [facility] Pt's family are present upon discharge.
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
## 2016-02-22 NOTE — Progress Notes (Signed)
Subjective: 2 Days Post-Op Procedure(s) (LRB): REMOVAL EXTERNAL FIXATION LEG (Left) OPEN REDUCTION INTERNAL FIXATION (ORIF) TIBIA/FIBULA FRACTURE (Left)  Patient reports pain as mild to moderate.  Tolerating POs well.  Denies fever, chills, N/V.  No reports of CP/SOB.  Patient hopeful for CIR.  Objective:   VITALS:  Temp:  [97.7 F (36.5 C)-98.3 F (36.8 C)] 98 F (36.7 C) (09/18 0629) Pulse Rate:  [67-78] 68 (09/18 0629) Resp:  [16-18] 16 (09/18 0629) BP: (102-126)/(49-59) 102/57 (09/18 0629) SpO2:  [92 %-95 %] 94 % (09/18 0629)  General: WDWN patient in NAD. Psych:  Appropriate mood and affect. Neuro:  A&O x 3, Moving all extremities, sensation intact to light touch HEENT:  EOMs intact Chest:  Even non-labored respirations Skin:  Dressings/splints C/D/I, no rashes or lesions Extremities: warm/dry, mild edema, no erythema or echymosis.  No lymphadenopathy. Pulses: Popliteus 2+ MSK:  ROM: EHL/FHL intact, MMT: patient is able to perform quad sets    LABS No results for input(s): HGB, WBC, PLT in the last 72 hours.  Recent Labs  02/20/16 0531  CREATININE 0.57   No results for input(s): LABPT, INR in the last 72 hours.   Assessment/Plan: 2 Days Post-Op Procedure(s) (LRB): REMOVAL EXTERNAL FIXATION LEG (Left) OPEN REDUCTION INTERNAL FIXATION (ORIF) TIBIA/FIBULA FRACTURE (Left)  NWB bilateral LE Lovenox for DVT prophylaxis D/C to CIR when bed available. Plan for outpatient post-op visit with Dr. Lorrin JacksonHewitt  Jaivon Vanbeek, PA-C, ATC [facility] Office:  [phone]

## 2016-02-23 ENCOUNTER — Inpatient Hospital Stay (HOSPITAL_COMMUNITY): Payer: Self-pay | Admitting: Physical Therapy

## 2016-02-23 ENCOUNTER — Inpatient Hospital Stay (HOSPITAL_COMMUNITY): Payer: Medicaid Other | Admitting: Speech Pathology

## 2016-02-23 ENCOUNTER — Inpatient Hospital Stay (HOSPITAL_COMMUNITY): Payer: Medicaid Other | Admitting: Occupational Therapy

## 2016-02-23 ENCOUNTER — Inpatient Hospital Stay (HOSPITAL_COMMUNITY): Payer: Medicaid Other

## 2016-02-23 DIAGNOSIS — E8809 Other disorders of plasma-protein metabolism, not elsewhere classified: Secondary | ICD-10-CM

## 2016-02-23 DIAGNOSIS — K5903 Drug induced constipation: Secondary | ICD-10-CM

## 2016-02-23 DIAGNOSIS — S92901S Unspecified fracture of right foot, sequela: Secondary | ICD-10-CM

## 2016-02-23 DIAGNOSIS — D62 Acute posthemorrhagic anemia: Secondary | ICD-10-CM

## 2016-02-23 DIAGNOSIS — G8918 Other acute postprocedural pain: Secondary | ICD-10-CM

## 2016-02-23 DIAGNOSIS — S36899S Unspecified injury of other intra-abdominal organs, sequela: Secondary | ICD-10-CM

## 2016-02-23 DIAGNOSIS — E46 Unspecified protein-calorie malnutrition: Secondary | ICD-10-CM

## 2016-02-23 DIAGNOSIS — M79609 Pain in unspecified limb: Secondary | ICD-10-CM

## 2016-02-23 DIAGNOSIS — S92301S Fracture of unspecified metatarsal bone(s), right foot, sequela: Secondary | ICD-10-CM

## 2016-02-23 DIAGNOSIS — T1490 Injury, unspecified: Secondary | ICD-10-CM

## 2016-02-23 DIAGNOSIS — S8292XS Unspecified fracture of left lower leg, sequela: Secondary | ICD-10-CM

## 2016-02-23 DIAGNOSIS — Z419 Encounter for procedure for purposes other than remedying health state, unspecified: Secondary | ICD-10-CM

## 2016-02-23 LAB — COMPREHENSIVE METABOLIC PANEL
ALT: 25 U/L (ref 14–54)
AST: 29 U/L (ref 15–41)
Albumin: 2.7 g/dL — ABNORMAL LOW (ref 3.5–5.0)
Alkaline Phosphatase: 66 U/L (ref 38–126)
Anion gap: 8 (ref 5–15)
BUN: 7 mg/dL (ref 6–20)
CHLORIDE: 98 mmol/L — AB (ref 101–111)
CO2: 29 mmol/L (ref 22–32)
CREATININE: 0.67 mg/dL (ref 0.44–1.00)
Calcium: 8.5 mg/dL — ABNORMAL LOW (ref 8.9–10.3)
GFR calc non Af Amer: >60 mL/min (ref >60)
Glucose, Bld: 102 mg/dL — ABNORMAL HIGH (ref 65–99)
Potassium: 4.2 mmol/L (ref 3.5–5.1)
SODIUM: 135 mmol/L (ref 135–145)
Total Bilirubin: 1 mg/dL (ref 0.3–1.2)
Total Protein: 6 g/dL — ABNORMAL LOW (ref 6.5–8.1)

## 2016-02-23 LAB — CBC WITH DIFFERENTIAL/PLATELET
BASOS ABS: 0 10*3/uL (ref 0.0–0.1)
Basophils Relative: 0 %
EOS ABS: 0.3 10*3/uL (ref 0.0–0.7)
EOS PCT: 4 %
HCT: 33.6 % — ABNORMAL LOW (ref 36.0–46.0)
Hemoglobin: 10.4 g/dL — ABNORMAL LOW (ref 12.0–15.0)
Lymphocytes Relative: 21 %
Lymphs Abs: 1.4 10*3/uL (ref 0.7–4.0)
MCH: 27.9 pg (ref 26.0–34.0)
MCHC: 31 g/dL (ref 30.0–36.0)
MCV: 90.1 fL (ref 78.0–100.0)
Monocytes Absolute: 0.6 10*3/uL (ref 0.1–1.0)
Monocytes Relative: 9 %
Neutro Abs: 4.4 10*3/uL (ref 1.7–7.7)
Neutrophils Relative %: 66 %
PLATELETS: 355 10*3/uL (ref 150–400)
RBC: 3.73 MIL/uL — AB (ref 3.87–5.11)
RDW: 14.2 % (ref 11.5–15.5)
WBC: 6.6 10*3/uL (ref 4.0–10.5)

## 2016-02-23 MED ORDER — BENEPROTEIN PO POWD
1.0000 | Freq: Three times a day (TID) | ORAL | Status: DC
  Administered 2016-02-23 – 2016-02-28 (×13): 6 g via ORAL
  Filled 2016-02-23: qty 227

## 2016-02-23 MED ORDER — BISACODYL 10 MG RE SUPP
10.0000 mg | Freq: Every day | RECTAL | Status: DC | PRN
  Administered 2016-02-23: 10 mg via RECTAL
  Filled 2016-02-23: qty 1

## 2016-02-23 MED ORDER — FLEET ENEMA 7-19 GM/118ML RE ENEM: 1.0000 | ENEMA | Freq: Every day | RECTAL | Status: DC | PRN

## 2016-02-23 MED ORDER — SENNOSIDES-DOCUSATE SODIUM 8.6-50 MG PO TABS
1.0000 | ORAL_TABLET | Freq: Two times a day (BID) | ORAL | Status: DC
  Administered 2016-02-23 – 2016-03-05 (×22): 1 via ORAL
  Filled 2016-02-23 (×23): qty 1
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
## 2016-02-23 NOTE — Progress Notes (Signed)
Patient information reviewed and entered into eRehab system by Sheri Prows, RN, CRRN, PPS Coordinator.  Information including medical coding and functional independence measure will be reviewed and updated through discharge.    

## 2016-02-23 NOTE — Plan of Care (Signed)
Problem: RH Attention Goal: LTG Patient will demonstrate focused/sustained (OT) LTG:  Patient will demonstrate focused/sustained/selective/alternating/divided attention during functional activities in specific environment with assist for # of minutes  (OT) Outcome: Not Applicable Date Met: 28/20/60 error
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
## 2016-02-23 NOTE — Progress Notes (Signed)
*  Preliminary Results* Bilateral lower extremity venous duplex completed. Study was technically limited due to bandages. Visualized veins of bilateral lower extremities are negative for deep vein thrombosis. There is no evidence of Baker's cyst bilaterally.  02/23/2016 11:50 AM Gertie FeyMichelle Autum Benfer, BS, RVT, RDCS, RDMS

## 2016-02-24 ENCOUNTER — Inpatient Hospital Stay (HOSPITAL_COMMUNITY): Payer: Medicaid Other | Admitting: Speech Pathology

## 2016-02-24 ENCOUNTER — Inpatient Hospital Stay (HOSPITAL_COMMUNITY): Payer: Medicaid Other | Admitting: *Deleted

## 2016-02-24 ENCOUNTER — Inpatient Hospital Stay (HOSPITAL_COMMUNITY): Payer: Medicaid Other | Admitting: Occupational Therapy

## 2016-02-24 ENCOUNTER — Inpatient Hospital Stay (HOSPITAL_COMMUNITY): Payer: Self-pay | Admitting: Physical Therapy

## 2016-02-24 DIAGNOSIS — F0781 Postconcussional syndrome: Secondary | ICD-10-CM

## 2016-02-24 MED ORDER — AMITRIPTYLINE HCL 25 MG PO TABS
25.0000 mg | ORAL_TABLET | Freq: Every day | ORAL | Status: DC
  Administered 2016-02-24 – 2016-03-04 (×10): 25 mg via ORAL
  Filled 2016-02-24 (×10): qty 1

## 2016-02-24 NOTE — Plan of Care (Signed)
Problem: RH SKIN INTEGRITY Goal: RH STG ABLE TO PERFORM INCISION/WOUND CARE W/ASSISTANCE STG Able To Perform Incision/Wound Care With Assistance.  Outcome: Not Applicable Date Met: 15/37/94 Currently no incision/wound care needed to be performed yet.  Problem: RH COGNITION-NURSING Goal: RH STG USES MEMORY AIDS/STRATEGIES W/ASSIST TO PROBLEM SOLVE STG Uses Memory Aids/Strategies With Assistance to Problem Solve.  Outcome: Not Applicable Date Met: 32/76/14 Doesn't have any memory problems.
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
## 2016-02-24 NOTE — Progress Notes (Signed)
Patient A/O X 4. Pleasant and cooperative. Has been continent of B&B this shift. Able to hold own beverage cup and medicine cup. Able to use call bell when needs assistance. Voices needs appropriately. Used the bedpan for B&B. Able to recall medications taken earlier in the day. Aware of safety issues. Calm and cooperative. Family at bedside.  Will continue to monitor and follow plan of care.

## 2016-02-24 NOTE — Progress Notes (Signed)
Occupational Therapy Session Note  Patient Details  Name: Priscilla Jacobs MRN: [id number] Date of Birth: Nov 11, 1975  Today's Date: 02/24/2016 OT Individual Time: 0900-1000 OT Individual Time Calculation (min): 60 min     Short Term Goals: Week 1:  OT Short Term Goal 1 (Week 1): Pt will perform toilet transfer with min A in order to decrease level of assist with self care.  OT Short Term Goal 2 (Week 1): Pt will perform clothing management for toileting task with min A.  OT Short Term Goal 3 (Week 1): Pt will perform LB dressing with mod A in order to decrease level of assist needed. OT Short Term Goal 4 (Week 1): Pt will perform bathing tasks with min A in order to increase I with self care.  Skilled Therapeutic Interventions/Progress Updates:    Upon entering the room, pt supine in bed with 5/10 c/o pain in B LE's at rest. RN gave medications prior to this session. Pt agreeable to OT intervention. Pt engaged in bathing with rolls L <> R to assist with washing buttocks and peri area. Pt able to wash UB with set up A. Pt verbalizing urgency for BM and bed pan placed. Pt needing assistance for hygiene. OT educating pt on use of long handled reacher in order to don elastic waist pants. Pt able to thread R LE but needing assist for L LE and able to utilize reacher to pull further up legs. OT assisting with pulling over B hips. Pt donned UB clothing with min A to pull sports bra down trunk. Pt performed anterior posterior transfer from bed >wheelchair with min A for B LE's. Pt seated in wheelchair at sink for grooming tasks with call bell within reach upon exiting the room.   Therapy Documentation Precautions:  Restrictions Weight Bearing Restrictions: Yes RLE Weight Bearing: Non weight bearing LLE Weight Bearing: Non weight bearing General:   Vital Signs: Therapy Vitals Temp: 97.3 F (36.3 C) Temp Source: Oral Pulse Rate: 76 Resp: 18 BP: (!) 144/76 Patient Position (if appropriate):  Sitting Oxygen Therapy SpO2: 97 % O2 Device: Not Delivered Pain: Pain Assessment Pain Assessment: No/denies pain  See Function Navigator for Current Functional Status.   Therapy/Group: Individual Therapy  Lowella Gripittman, Karmin Kasprzak L 02/24/2016, 4:28 PM

## 2016-02-24 NOTE — Progress Notes (Signed)
Physical Therapy Session Note  Patient Details  Name: Priscilla MaduroSusan Wingler MRN: [id number] Date of Birth: November 04, 1975  Today's Date: 02/24/2016 PT Individual Time: [id number] and 1300-1350 PT Individual Time Calculation (min): 24 min and 50 min   Short Term Goals: Week 1:  PT Short Term Goal 1 (Week 1): Pt will perform bed mobility on flat bed with supervision PT Short Term Goal 2 (Week 1): Pt will perform bed <> chair transfers A/P with supervision and verbalizing 50% of transfer PT Short Term Goal 3 (Week 1): Pt will engage in sitting balance and strengthening activities with LE down to 45 deg supported x 10 minutes PT Short Term Goal 4 (Week 1): Pt will perform w/c mobility x 150' supervision and up/down ramp with mod A PT Short Term Goal 5 (Week 1): Pt will perform simulated car transfer (back seat) with min A  Skilled Therapeutic Interventions/Progress Updates:  Pt received in w/c meeting with recreation therapist; pt reporting a better morning with getting dressed and ability to transfer into w/c with OT.  Pt given two options for session with pt choosing to work on gradually lowering LE while performing UE strengthening.  In gym lowered pt's LE to pt's tolerance and then performed 1 set x 12 reps orange theraband resisted scap retraction rows, tricep extension, and latissimus extensions.  Discussed with pt importance of performing weight shifting, lateral leans and boosting in w/c for pressure relief during prolonged time in w/c.  Pt left in room with all items within reach.  PM session: Pt received in w/c requesting to attempt to use BSC this afternoon to void.  Discussed and demonstrated to pt how to set up [facility] and perform transfer and also discussed goal of having LE in dependent position by D/C to allow Mod I transfers.  Set up BSC beside bed and removed back bar; pt able to maintain LE elevation while therapist removed leg rests.  Therapist supported LE while pt [facility] w[facility]c [facility]; pt  performed anterior transfer [facility] with min A.  Pt performed lateral leans with supervision-min A for clothing management and required assistance for hygiene.  Returned to w/c with posterior transfer and min A.  Continued UE strengthening in w/c with orange theraband performing another set of am exercises and one set x 12 reps of w/c sit ups from reclined position against resistance of theraband.  Pt left in w/c with husband present and all items within reach.  Therapy Documentation Precautions:  Restrictions Weight Bearing Restrictions: Yes RLE Weight Bearing: Non weight bearing LLE Weight Bearing: Non weight bearing Pain: Pain Assessment Pain Score: 6    See Function Navigator for Current Functional Status.   Therapy/Group: Individual Therapy  Edman CircleHall, Eather Chaires [facility] HospitalFaucette 02/24/2016, 12:25 PM

## 2016-02-24 NOTE — Progress Notes (Signed)
Speech Language Pathology Daily Session Note  Patient Details  Name: Priscilla Jacobs MRN: [id number] Date of Birth: [birth year]  Today's Date: 02/24/2016 SLP Individual Time: 0800-0900 SLP Individual Time Calculation (min): 60 min   Short Term Goals: Week 1: SLP Short Term Goal 1 (Week 1): (P) Patient will recall new, daily information with Min A verbal and question cues.  SLP Short Term Goal 2 (Week 1): (P) Patient will demonstrate functional problem solving for mildly complex tasks with Min A verbal cues.  SLP Short Term Goal 3 (Week 1): Patient will self-monitor and correct errors with functional tasks with Min A question and verbal cues.   Skilled Therapeutic Interventions:Pt seen for skilled SLP treatment targeting cognitive goals. Pt required mod A for moderately complex reasoning tasks pertaining to functional math. Education initiated re: cognitive fatigue and need for intermittent rest breaks to avoid HA and blurry vision. Min A for verbal cue for recall of novel information with detail.    Function:  Eating Eating                 Cognition Comprehension Comprehension assist level: Understands basic 90% of the time/cues < 10% of the time  Expression   Expression assist level: Expresses basic 90% of the time/requires cueing < 10% of the time.  Social Interaction Social Interaction assist level: Interacts appropriately with others with medication or extra time (anti-anxiety, antidepressant).  Problem Solving Problem solving assist level: Solves basic 50 - 74% of the time/requires cueing 25 - 49% of the time  Memory Memory assist level: Recognizes or recalls 50 - 74% of the time/requires cueing 25 - 49% of the time    Pain Pain Assessment Pain Assessment: No/denies pain Pain Score: 6   Therapy/Group: Individual Therapy  Rocky CraftsKara E Twain Stenseth MA, CCC-SLP 02/24/2016, 12:46 PM

## 2016-02-25 ENCOUNTER — Inpatient Hospital Stay (HOSPITAL_COMMUNITY): Payer: Medicaid Other | Admitting: Occupational Therapy

## 2016-02-25 ENCOUNTER — Inpatient Hospital Stay (HOSPITAL_COMMUNITY): Payer: Medicaid Other | Admitting: Speech Pathology

## 2016-02-25 ENCOUNTER — Encounter (HOSPITAL_COMMUNITY): Payer: Self-pay

## 2016-02-25 ENCOUNTER — Inpatient Hospital Stay (HOSPITAL_COMMUNITY): Payer: Self-pay | Admitting: Physical Therapy

## 2016-02-25 ENCOUNTER — Inpatient Hospital Stay (HOSPITAL_COMMUNITY): Payer: Medicaid Other

## 2016-02-25 DIAGNOSIS — S060X9A Concussion with loss of consciousness of unspecified duration, initial encounter: Secondary | ICD-10-CM

## 2016-02-25 DIAGNOSIS — S060X9S Concussion with loss of consciousness of unspecified duration, sequela: Secondary | ICD-10-CM

## 2016-02-25 MED ORDER — OXYCODONE HCL ER 10 MG PO T12A
10.0000 mg | EXTENDED_RELEASE_TABLET | Freq: Two times a day (BID) | ORAL | Status: DC
  Administered 2016-02-25 – 2016-02-29 (×8): 10 mg via ORAL
  Filled 2016-02-25 (×8): qty 1

## 2016-02-25 NOTE — Progress Notes (Signed)
Physical Therapy Session Note  Patient Details  Name: Priscilla MaduroSusan Fuhs MRN: [id number] Date of Birth: 04/11/1976  Today's Date: 02/25/2016 PT Individual Time: 1030-1105 PT Individual Time Calculation (min): 35 min   Short Term Goals: Week 1:  PT Short Term Goal 1 (Week 1): Pt will perform bed mobility on flat bed with supervision PT Short Term Goal 2 (Week 1): Pt will perform bed <> chair transfers A/P with supervision and verbalizing 50% of transfer PT Short Term Goal 3 (Week 1): Pt will engage in sitting balance and strengthening activities with LE down to 45 deg supported x 10 minutes PT Short Term Goal 4 (Week 1): Pt will perform w/c mobility x 150' supervision and up/down ramp with mod A PT Short Term Goal 5 (Week 1): Pt will perform simulated car transfer (back seat) with min A  Skilled Therapeutic Interventions/Progress Updates:w/c propulsion x 150' using bil UEs with supervision; pt tolerated lowering leg rests a few degrees while propelling.  A-P transfer to level car seat in simulated car.  Instructed pt in diaphragmatic breathing for anxiety control.  Therapeutic exercise performed with LE to increase strength for functional mobility: in long sitting, bil hip internal rotation with 3 second hold; R hip abduction/adduction.  Pt left resting in w/c with all needs within reach.     Therapy Documentation Precautions:  Restrictions Weight Bearing Restrictions: Yes RLE Weight Bearing: Non weight bearing LLE Weight Bearing: Non weight bearing Pain: 2/10 bil lower legs, premedicated       See Function Navigator for Current Functional Status.   Therapy/Group: Individual Therapy  Analeya Luallen 02/25/2016, 4:39 PM

## 2016-02-25 NOTE — Progress Notes (Signed)
Social Work Patient ID: Priscilla MaduroSusan Jacobs, female   DOB: 23-Jun-1975, 40 y.o.   MRN: [id number]  Have reviewed team conference with pt and and spouse.  Both aware and agreeable with targeted d/c date of 9/30 with mod independent w/c goals.  Pt sitting in w/c with legs lowered and she was pleased with being able to tolerate that more.  She reports that she felt her session with Dr. Wylene SimmerPollard was helpful and plans to see her again next week.  Will continue to follow.  Julyanna Scholle, LCSW
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
## 2016-02-25 NOTE — Progress Notes (Signed)
Occupational Therapy Session Note  Patient Details  Name: Priscilla MaduroSusan Lenhard MRN: [id number] Date of Birth: [birth year]  Today's Date: 02/25/2016 OT Individual Time: [id number] and 1530-1600 OT Individual Time Calculation (min): 58 min and 30 min     Short Term Goals: Week 1:  OT Short Term Goal 1 (Week 1): Pt will perform toilet transfer with min A in order to decrease level of assist with self care.  OT Short Term Goal 2 (Week 1): Pt will perform clothing management for toileting task with min A.  OT Short Term Goal 3 (Week 1): Pt will perform LB dressing with mod A in order to decrease level of assist needed. OT Short Term Goal 4 (Week 1): Pt will perform bathing tasks with min A in order to increase I with self care.  Skilled Therapeutic Interventions/Progress Updates:    Session 1:Upon entering the room, pt supine in bed with urgency for toileting. Pt performed supine >sit with supervision and use of bed rails.Pt performed anterior posterior transfer from bed >drop arm commode chair with min A. Pt voiding onto toilet and requesting to return to bed for hygiene. OT assisted pt with peri care this session as pt cannot reach secondary to body habitus. Pt utilized LH reacher to thread R LE and was able to pull over B hips this session. Pt donned UB clothing with set up A and then transferred into wheelchair at end of session. OT having to hold B LE's and sit onto leg rests as pt is unable to tolerate them hanging . Call bell and all needed items within reach upon exiting the room.   Session 2: Upon entering the room, pt seated in wheelchair with husband present in the room. Pt requesting therapist demonstrate how to assist pt with washing hair while seated in wheelchair. OT demonstrated task and husband assisted and verbalized understanding. OT also educating pt on pain management in order to decrease pain to participate further with therapy sessions. Pt verbalizing understanding. Husband remains in room  assisting pt with hair upon exiting.   Therapy Documentation Precautions:  Restrictions Weight Bearing Restrictions: Yes RLE Weight Bearing: Non weight bearing LLE Weight Bearing: Non weight bearing Pain: Pain Assessment Pain Score: 2  (bil LEs) Pain Intervention(s): Medication (See eMAR)  See Function Navigator for Current Functional Status.   Therapy/Group: Individual Therapy  Lowella Gripittman, Talma Aguillard L 02/25/2016, 12:45 PM
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
## 2016-02-25 NOTE — Progress Notes (Signed)
Physical Therapy Session Note  Patient Details  Name: Priscilla Jacobs MRN: [id number] Date of Birth: Mar 31, 1976  Today's Date: 02/25/2016 PT Individual Time: 0900-1000 PT Individual Time Calculation (min): 60 min    Short Term Goals: Week 1:  PT Short Term Goal 1 (Week 1): Pt will perform bed mobility on flat bed with supervision PT Short Term Goal 2 (Week 1): Pt will perform bed <> chair transfers A/P with supervision and verbalizing 50% of transfer PT Short Term Goal 3 (Week 1): Pt will engage in sitting balance and strengthening activities with LE down to 45 deg supported x 10 minutes PT Short Term Goal 4 (Week 1): Pt will perform w/c mobility x 150' supervision and up/down ramp with mod A PT Short Term Goal 5 (Week 1): Pt will perform simulated car transfer (back seat) with min A  Skilled Therapeutic Interventions/Progress Updates:   Pt presented in w/c agreeable for therapy.  Pt able to unlock breaks independently and propel approx 131f with min cues for negotiation. Pt practiced ramp x2 requiring modA for pushing over threshold, per pt request 2/2 anxiety PTA holding onto w/c while ascending and descending. Pt practiced in/out elevator and propelled outside on sidewalk and ramp to sidewalk.  Pt requiring min cues for negotiating over thresholds however noted improved performance when returning inside hospital entrance. Pt able to back up into elevator with x1 cue for directions. Upon returning to rehab gym pt encouraged to lower ELR to approx 45 degree angle. Performed UE therex including tricep extensions and  bicep curls with 3# 2x 15 bilaterally and scap retractions with L2 resistance band.  Pt returned to room 2/2 time constraints and encouraged to keep LE lowered as long as possible.  Pt left in w/c awaiting next PT session with all current needs met.   Therapy Documentation Precautions:  Restrictions Weight Bearing Restrictions: Yes RLE Weight Bearing: Non weight bearing LLE Weight  Bearing: Non weight bearing General:   Vital Signs:   Pain: Pain Assessment Pain Score: 2  (bil LEs) Pain Intervention(s): Medication (See eMAR) Mobility:      See Function Navigator for Current Functional Status.   Therapy/Group: Individual Therapy  Arron Tetrault  Rindy Kollman, PTA  02/25/2016, 12:22 PM

## 2016-02-25 NOTE — Care Management Note (Signed)
\  [facility] Individual Statement of Services  Patient Name:  Priscilla Jacobs  Date:  02/24/2016  Welcome to the [facility].  Our goal is to provide you with an individualized program based on your diagnosis and situation, designed to meet your specific needs.  With this comprehensive rehabilitation program, you will be expected to participate in at least 3 hours of rehabilitation therapies Monday-Friday, with modified therapy programming on the weekends.  Your rehabilitation program will include the following services:  Physical Therapy (PT), Occupational Therapy (OT), 24 hour per day rehabilitation nursing, Therapeutic Recreaction (TR), Neuropsychology, Case Management (Social Worker), Rehabilitation Medicine, Nutrition Services and Pharmacy Services  Weekly team conferences will be held on Wednesdays to discuss your progress.  Your Social Worker will talk with you frequently to get your input and to update you on team discussions.  Team conferences with you and your family in attendance may also be held.  Expected length of stay: 12-14 days  Overall anticipated outcome: mod independent at wheelchair  Depending on your progress and recovery, your program may change. Your Social Worker will coordinate services and will keep you informed of any changes. Your Social Worker's name and contact numbers are listed  below.  The following services may also be recommended but are not provided by the [facility]:   Driving Evaluations  Home Health Rehabiltiation Services  Outpatient Rehabilitation Services  Vocational Rehabilitation   Arrangements will be made to provide these services after discharge if needed.  Arrangements include referral to agencies that provide these services.  Your insurance has been verified to be:  None [facility]  Pertinent information will be shared with your doctor and  your insurance company.  Social Worker:  LankinLucy Hillery Zachman, [place] [phone] or ([phone]) [phone]   Information discussed with and copy given to patient by: Amada JupiterHOYLE, Jaysa Kise, 02/24/2016, 1:01 PM

## 2016-02-25 NOTE — Progress Notes (Signed)
Speech Language Pathology Daily Session Note  Patient Details  Name: Priscilla MaduroSusan Jacobs MRN: [id number] Date of Birth: [birth year]  Today's Date: 02/25/2016 SLP Individual Time: 1330-1405 SLP Individual Time Calculation (min): 35 min   Short Term Goals: Week 1: SLP Short Term Goal 1 (Week 1): (P) Patient will recall new, daily information with Min A verbal and question cues.  SLP Short Term Goal 2 (Week 1): (P) Patient will demonstrate functional problem solving for mildly complex tasks with Min A verbal cues.  SLP Short Term Goal 3 (Week 1): Patient will self-monitor and correct errors with functional tasks with Min A question and verbal cues.   Skilled Therapeutic Interventions: Skilled treatment session focused on cognitive goals. SLP facilitated session by providing extra time and supervision question cues for problem solving during a mildly complex, novel card task. Patient recalled rules to task throughout session with Mod I. Patient propelled her wheelchair to and from the SLP office with Mod I. Patient left upright in wheelchair with all needs within reach. Continue with current plan of care.   Function:  Cognition Comprehension Comprehension assist level: Understands basic 90% of the time/cues < 10% of the time  Expression   Expression assist level: Expresses basic 90% of the time/requires cueing < 10% of the time.  Social Interaction Social Interaction assist level: Interacts appropriately with others with medication or extra time (anti-anxiety, antidepressant).  Problem Solving Problem solving assist level: Solves basic 75 - 89% of the time/requires cueing 10 - 24% of the time  Memory Memory assist level: Recognizes or recalls 50 - 74% of the time/requires cueing 25 - 49% of the time    Pain No/Denies Pain   Therapy/Group: Individual Therapy  [name] 02/25/2016, 4:22 PM

## 2016-02-26 ENCOUNTER — Inpatient Hospital Stay (HOSPITAL_COMMUNITY): Payer: Medicaid Other | Admitting: Occupational Therapy

## 2016-02-26 ENCOUNTER — Inpatient Hospital Stay (HOSPITAL_COMMUNITY): Payer: Self-pay | Admitting: Physical Therapy

## 2016-02-26 ENCOUNTER — Inpatient Hospital Stay (HOSPITAL_COMMUNITY): Payer: Medicaid Other | Admitting: Speech Pathology

## 2016-02-26 DIAGNOSIS — M62838 Other muscle spasm: Secondary | ICD-10-CM

## 2016-02-26 MED ORDER — METHOCARBAMOL 500 MG PO TABS
500.0000 mg | ORAL_TABLET | Freq: Four times a day (QID) | ORAL | Status: DC | PRN
  Administered 2016-03-01 – 2016-03-05 (×6): 500 mg via ORAL
  Filled 2016-02-26 (×6): qty 1

## 2016-02-26 NOTE — Progress Notes (Signed)
Physical Therapy Session Note  Patient Details  Name: Priscilla MaduroSusan Empie MRN: [id number] Date of Birth: Feb 03, 1976  Today's Date: 02/26/2016 PT Individual Time: [id number] PT Individual Time Calculation (min): 70 min    Short Term Goals: Week 1:  PT Short Term Goal 1 (Week 1): Pt will perform bed mobility on flat bed with supervision PT Short Term Goal 2 (Week 1): Pt will perform bed <> chair transfers A/P with supervision and verbalizing 50% of transfer PT Short Term Goal 3 (Week 1): Pt will engage in sitting balance and strengthening activities with LE down to 45 deg supported x 10 minutes PT Short Term Goal 4 (Week 1): Pt will perform w/c mobility x 150' supervision and up/down ramp with mod A PT Short Term Goal 5 (Week 1): Pt will perform simulated car transfer (back seat) with min A  Skilled Therapeutic Interventions/Progress Updates:   Pt receive in w/c reporting pain in LE due to being lower and requesting pain medication and anti-dizziness medication; RN notified.  Pt performed w/c mobility to gym with supervision and discussed and demonstrated to pt how to perform leg rest management to set up for transfers; pt performed with min A.  Pt performed anterior scooting onto mat with supervision and therapist holding w/c placement.  Performed sit >supine with min A on flat mat.  Pt participated in LE therapeutic exercises for ROM, strengthening and pain management.  Pt given handouts of each exercise for HEP.  Performed supine > sit  And posterior scoot back to w/c with supervision-min A and returned to room with LE lowered 25% and left with all items within reach.  Therapy Documentation Precautions:  Restrictions Weight Bearing Restrictions: Yes RLE Weight Bearing: Non weight bearing LLE Weight Bearing: Non weight bearing   See Function Navigator for Current Functional Status.   Therapy/Group: Individual Therapy  Edman CircleHall, Emmalene [facility] 02/26/2016, 12:49 PM

## 2016-02-26 NOTE — Progress Notes (Signed)
Occupational Therapy Session Note  Patient Details  Name: Priscilla MaduroSusan Prange MRN: [id number] Date of Birth: October 02, 1975  Today's Date: 02/26/2016 OT Individual Time: [id number] OT Individual Time Calculation (min): 58 min     Short Term Goals: Week 1:  OT Short Term Goal 1 (Week 1): Pt will perform toilet transfer with min A in order to decrease level of assist with self care.  OT Short Term Goal 2 (Week 1): Pt will perform clothing management for toileting task with min A.  OT Short Term Goal 3 (Week 1): Pt will perform LB dressing with mod A in order to decrease level of assist needed. OT Short Term Goal 4 (Week 1): Pt will perform bathing tasks with min A in order to increase I with self care.  Skilled Therapeutic Interventions/Progress Updates:     Upon entering the room, pt in bed finishing breakfast with 4/10 c/o pain in B LE's. Pt verbalizing need for BM this session. Pt performed anterior posterior transfer from bed > drop arm commode with set up performed by therapist. While seated on commode, pt set up from bathing tasks. Pt donning UB clothing items with set up A. Pt performed lateral lean with steady assist but still unable to wipe buttocks after BM secondary to body habitus. OT educated pt on possibility of using toilet aide for task. Pt returning to bed for LB clothing management with use of LH reacher and set up A to obtain clothing items. Pt able to don underwear and pants with AE and rolling L <> R to pull over B hips. Pt transferred into wheelchair in same manner as stated above and sat in wheelchair for grooming tasks at mod I level. Call bell and all needed items within reach upon exiting the room.   Therapy Documentation Precautions:  Restrictions Weight Bearing Restrictions: Yes RLE Weight Bearing: Non weight bearing LLE Weight Bearing: Non weight bearing  See Function Navigator for Current Functional Status.   Therapy/Group: Individual Therapy  Lowella Gripittman, Andy Allende  L 02/26/2016, 2:38 PM
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
## 2016-02-26 NOTE — Progress Notes (Signed)
Physical Therapy Session Note  Patient Details  Name: Priscilla Jacobs MRN: [id number] Date of Birth: Mar 28, 1976  Today's Date: 02/26/2016 PT Individual Time: 1300-1330 PT Individual Time Calculation (min): 30 min    Short Term Goals: Week 1:  PT Short Term Goal 1 (Week 1): Pt will perform bed mobility on flat bed with supervision PT Short Term Goal 2 (Week 1): Pt will perform bed <> chair transfers A/P with supervision and verbalizing 50% of transfer PT Short Term Goal 3 (Week 1): Pt will engage in sitting balance and strengthening activities with LE down to 45 deg supported x 10 minutes PT Short Term Goal 4 (Week 1): Pt will perform w/c mobility x 150' supervision and up/down ramp with mod A PT Short Term Goal 5 (Week 1): Pt will perform simulated car transfer (back seat) with min A  Skilled Therapeutic Interventions/Progress Updates:   Pt presented in w/c agreeable for therapy. Pt c/o throbbing and "pins and needles" in BLE pt had legs lowered at 45 degree angle for approx 3 hours.  Raised BLE for approx 15 min and pt stated relief.  Pt stated her legs would swell when hanging for an extended time prior to event Propelled self to rehab gym and performed seated UE/LE therex as follows: Bicep curls 3# 2x15, bilateral shoulder flexion with 5# bar 2x15, chair push ups 2x10, QS 2x20, GS 2x20, modified SAQ with legs lowered to approx 45 degrees 2x15.  Pt also provided continued education on activities that are safe within NWB precautions as pt intially fearful of bending knees or wiggling L toes 2/2 causing additional harm.  Pt verbalized improved understanding and propelled approx 35f when met by SLP and handed off for next session.  Therapy Documentation Precautions:  Restrictions Weight Bearing Restrictions: Yes RLE Weight Bearing: Non weight bearing LLE Weight Bearing: Non weight bearing General:     See Function Navigator for Current Functional Status.   Therapy/Group: Individual  Therapy  Angelee Bahr  Tonianne Fine, PTA  02/26/2016, 1:38 PM

## 2016-02-26 NOTE — Progress Notes (Signed)
Speech Language Pathology Daily Session Note  Patient Details  Name: Priscilla MaduroSusan Jacobs MRN: [id number] Date of Birth: December 16, 1975  Today's Date: 02/26/2016 SLP Individual Time: 1330-1413 SLP Individual Time Calculation (min): 43 min   Short Term Goals: Week 1: SLP Short Term Goal 1 (Week 1): (P) Patient will recall new, daily information with Min A verbal and question cues.  SLP Short Term Goal 2 (Week 1): (P) Patient will demonstrate functional problem solving for mildly complex tasks with Min A verbal cues.  SLP Short Term Goal 3 (Week 1): Patient will self-monitor and correct errors with functional tasks with Min A question and verbal cues.   Skilled Therapeutic Interventions: Skilled treatment session focused on cognitive goals. Patient independently recalled the functions of her medications with 80% accuracy and organized a 4 time per day pill box with extra time and supervision verbal cues for problem solving. Patient also recalled events from previous therapy sessions with supervision question cues. Patient left upright in wheelchair with all needs within reach. Continue with current plan of care.   Function:  Cognition Comprehension Comprehension assist level: Understands basic 90% of the time/cues < 10% of the time  Expression   Expression assist level: Expresses basic 90% of the time/requires cueing < 10% of the time.  Social Interaction Social Interaction assist level: Interacts appropriately with others with medication or extra time (anti-anxiety, antidepressant).  Problem Solving Problem solving assist level: Solves complex 90% of the time/cues < 10% of the time  Memory Memory assist level: Recognizes or recalls 75 - 89% of the time/requires cueing 10 - 24% of the time    Pain No/Denies Pain   Therapy/Group: Individual Therapy  Jazelyn Sipe 02/26/2016, 3:46 PM

## 2016-02-27 ENCOUNTER — Inpatient Hospital Stay (HOSPITAL_COMMUNITY): Payer: Medicaid Other | Admitting: Speech Pathology

## 2016-02-27 DIAGNOSIS — H8109 Meniere's disease, unspecified ear: Secondary | ICD-10-CM
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
## 2016-02-27 NOTE — Progress Notes (Signed)
Speech Language Pathology Daily Session Note  Patient Details  Name: Molly MaduroSusan Abel MRN: [id number] Date of Birth: [birth year]  Today's Date: 02/27/2016 SLP Individual Time: 1015-1100 SLP Individual Time Calculation (min): 45 min   Short Term Goals: Week 1: SLP Short Term Goal 1 (Week 1): (P) Patient will recall new, daily information with Min A verbal and question cues.  SLP Short Term Goal 2 (Week 1): (P) Patient will demonstrate functional problem solving for mildly complex tasks with Min A verbal cues.  SLP Short Term Goal 3 (Week 1): Patient will self-monitor and correct errors with functional tasks with Min A question and verbal cues.   Skilled Therapeutic Interventions: Skilled treatment session focused on cognitive goals. SLP facilitated session by providing supervision verbal cues for organization during a basic calendar making task that focused on use of memory compensatory strategies. Patient initiated a complex scheduling task but was unable to complete. Will finish at next session. Patient left supine in bed with all needs within reach. Continue with current plan of care.   Function:  Cognition Comprehension Comprehension assist level: Understands basic 90% of the time/cues < 10% of the time  Expression   Expression assist level: Expresses basic 90% of the time/requires cueing < 10% of the time.  Social Interaction Social Interaction assist level: Interacts appropriately with others with medication or extra time (anti-anxiety, antidepressant).  Problem Solving Problem solving assist level: Solves complex 90% of the time/cues < 10% of the time  Memory Memory assist level: Recognizes or recalls 75 - 89% of the time/requires cueing 10 - 24% of the time    Pain Pain Assessment Pain Assessment: 0-10 Pain Score: 2  Pain Type: Acute pain Pain Location: Leg Pain Orientation: Left;Right Pain Descriptors / Indicators: Aching Pain Onset: On-going Pain Intervention(s): Medication  (See eMAR)  Therapy/Group: Individual Therapy  Zulema Pulaski 02/27/2016, 12:10 PM

## 2016-02-28 ENCOUNTER — Inpatient Hospital Stay (HOSPITAL_COMMUNITY): Payer: Medicaid Other | Admitting: Occupational Therapy

## 2016-02-28 ENCOUNTER — Inpatient Hospital Stay (HOSPITAL_COMMUNITY): Payer: Self-pay

## 2016-02-28 NOTE — Progress Notes (Signed)
Occupational Therapy Session Note  Patient Details  Name: Priscilla MaduroSusan Chaney MRN: [id number] Date of Birth: [birth year]  Today's Date: 02/28/2016 OT Individual Time: [id number] OT Individual Time Calculation (min): 70 min     Short Term Goals: Week 1:  OT Short Term Goal 1 (Week 1): Pt will perform toilet transfer with min A in order to decrease level of assist with self care.  OT Short Term Goal 2 (Week 1): Pt will perform clothing management for toileting task with min A.  OT Short Term Goal 3 (Week 1): Pt will perform LB dressing with mod A in order to decrease level of assist needed. OT Short Term Goal 4 (Week 1): Pt will perform bathing tasks with min A in order to increase I with self care.  Skilled Therapeutic Interventions/Progress Updates:   Patient completed skill OT therapy focused on increasing independence with bathing and dressing with use of reacher supine and with head of bed elevated at times today as follows:  UB bathing = setup;    LB bathing =mod assist as patient with difficulty reaching middle of back during lateral rolls supine in bed  UB dressing= setup of sports bra and pullover tshirt;            LB dressing= utilize reacher to don pants over both legs and feed and required superivision  - incorporated lateral rolls to pull up pants in the back (did not have panties to don today)  Posterior transfer from bed to wheelchair with Cllinician stabilizing wheel chair from sliding away on the floor  Grooming in w/c at sink with setup   Therapy Documentation Precautions:  Restrictions Weight Bearing Restrictions: Yes RLE Weight Bearing: Non weight bearing LLE Weight Bearing: Non weight bearing  Pain:denied  See Function Navigator for Current Functional Status.   Therapy/Group: Individual Therapy  Bud Faceickett, Jeancarlo Leffler [facility] 02/28/2016, 5:30 PM

## 2016-02-28 NOTE — Progress Notes (Signed)
Physical Therapy Session Note  Patient Details  Name: Priscilla Jacobs MRN: [id number] Date of Birth: [birth year]  Today's Date: 02/28/2016 PT Individual Time: 0930-1030 PT Individual Time Calculation (min): 60 min    Short Term Goals: Week 1:  PT Short Term Goal 1 (Week 1): Pt will perform bed mobility on flat bed with supervision PT Short Term Goal 2 (Week 1): Pt will perform bed <> chair transfers A/P with supervision and verbalizing 50% of transfer PT Short Term Goal 3 (Week 1): Pt will engage in sitting balance and strengthening activities with LE down to 45 deg supported x 10 minutes PT Short Term Goal 4 (Week 1): Pt will perform w/c mobility x 150' supervision and up/down ramp with mod A PT Short Term Goal 5 (Week 1): Pt will perform simulated car transfer (back seat) with min A  Skilled Therapeutic Interventions/Progress Updates:    Session focused on w/c mobility for endurance and parts management training for set-up with transfers, A/P transfer on and off mat with focus on pt directing care (overall supervision level with PT stabilizing w/c) and pt able to problem solve technique and recall sequence, supine therex for BLE ROM and strengthening, and d/c planning discussion in regards to home set-up, kitchen accessibility, and progression of therapies after discharge. Instructed in hip flexion, hip abduction, isometric hip adduction, and SAQ x 15 reps each x 2 sets BLE.   Therapy Documentation Precautions:  Restrictions Weight Bearing Restrictions: Yes RLE Weight Bearing: Non weight bearing LLE Weight Bearing: Non weight bearing   Pain: 3/10 pain in BLE (L > R). Premedicated.  See Function Navigator for Current Functional Status.   Therapy/Group: Individual Therapy  Karolee StampsGray, Williard Keller Darrol PokeBrescia  Ebone Alcivar B. Santhosh Gulino, PT, DPT  02/28/2016, 12:05 PM
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
## 2016-02-29 ENCOUNTER — Inpatient Hospital Stay (HOSPITAL_COMMUNITY): Payer: Medicaid Other | Admitting: Occupational Therapy

## 2016-02-29 ENCOUNTER — Inpatient Hospital Stay (HOSPITAL_COMMUNITY): Payer: Medicaid Other | Admitting: Physical Therapy

## 2016-02-29 ENCOUNTER — Inpatient Hospital Stay (HOSPITAL_COMMUNITY): Payer: Medicaid Other | Admitting: Speech Pathology

## 2016-02-29 DIAGNOSIS — F4321 Adjustment disorder with depressed mood: Secondary | ICD-10-CM

## 2016-02-29 DIAGNOSIS — F4311 Post-traumatic stress disorder, acute: Secondary | ICD-10-CM

## 2016-02-29 DIAGNOSIS — S060X1S Concussion with loss of consciousness of 30 minutes or less, sequela: Secondary | ICD-10-CM

## 2016-02-29 LAB — CREATININE, SERUM: CREATININE: 0.71 mg/dL (ref 0.44–1.00)

## 2016-02-29 MED ORDER — PRO-STAT SUGAR FREE PO LIQD
30.0000 mL | Freq: Two times a day (BID) | ORAL | Status: DC
  Administered 2016-02-29 – 2016-03-05 (×10): 30 mL via ORAL
  Filled 2016-02-29 (×10): qty 30
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
## 2016-02-29 NOTE — Progress Notes (Signed)
Plan is for Priscilla Jacobs to follow-up with Dr. Victorino DikeHewitt week after discharge to remove bilateral lower extremity splints removed sutures and placed in short-leg cast

## 2016-02-29 NOTE — Progress Notes (Signed)
Physical Therapy Session Note  Patient Details  Name: Priscilla Jacobs MRN: [id number] Date of Birth: November 29, 1975  Today's Date: 02/29/2016 PT Individual Time: 0840-1000 PT Individual Time Calculation (min): 80 min    Short Term Goals: Week 1:  PT Short Term Goal 1 (Week 1): Pt will perform bed mobility on flat bed with supervision PT Short Term Goal 2 (Week 1): Pt will perform bed <> chair transfers A/P with supervision and verbalizing 50% of transfer PT Short Term Goal 3 (Week 1): Pt will engage in sitting balance and strengthening activities with LE down to 45 deg supported x 10 minutes PT Short Term Goal 4 (Week 1): Pt will perform w/c mobility x 150' supervision and up/down ramp with mod A PT Short Term Goal 5 (Week 1): Pt will perform simulated car transfer (back seat) with min A  Skilled Therapeutic Interventions/Progress Updates:   Pt in bed requesting to use bedpan, pt able to roll to side using rails no assist. Pt able to instruct PTA in placement of bed and w/c to allow A/P transfer to w/c with appropriate cues when needed. Pt propelled to 277f with w/c with supervision negotiating turns, narrow spaces, and entryways.  Pt practiced ascend/decend ramp requiring minA for pushing rear wheels over ramp threshold.  Pt encouraged to drop LE to 45degree angle with pillows.  Provided pt edu on benefits of tolerating lowered LE, facilitating transfers, easier negotiation of w/c in small spaces, etc. Pt again instructed PTA on positioning of w/c to high/low mat and pt able to A/P transfer to mat with supervision. Pt performed LE supine therex, incl, SLR, hip abd, SAQ, GS, x10 bilaterally, chest press and chair push ups x 10 for UE strengthening. Pt transferred to w/c in same manner as above and propelled self to room.  Encouraged pt to keep legs at 45 degee as long as could tolerate, pt left in w/c with all current needs met.   Therapy Documentation Precautions:  Restrictions Weight Bearing  Restrictions: Yes RLE Weight Bearing: Non weight bearing LLE Weight Bearing: Non weight bearing General:   Vital Signs:     See Function Navigator for Current Functional Status.   Therapy/Group: Individual Therapy  Iceis Knab  Eiley Mcginnity, PTA  02/29/2016, 12:26 PM

## 2016-02-29 NOTE — Progress Notes (Signed)
Occupational Therapy Session Note  Patient Details  Name: Priscilla MaduroSusan Jacobs MRN: [id number] Date of Birth: 1975-12-25  Today's Date: 02/29/2016 OT Individual Time: 1000-1057 OT Individual Time Calculation (min): 57 min     Short Term Goals: Week 1:  OT Short Term Goal 1 (Week 1): Pt will perform toilet transfer with min A in order to decrease level of assist with self care.  OT Short Term Goal 2 (Week 1): Pt will perform clothing management for toileting task with min A.  OT Short Term Goal 3 (Week 1): Pt will perform LB dressing with mod A in order to decrease level of assist needed. OT Short Term Goal 4 (Week 1): Pt will perform bathing tasks with min A in order to increase I with self care.  Skilled Therapeutic Interventions/Progress Updates:    Upon entering the room, pt seated in wheelchair awaiting OT arrival. Pt engaged in bathing while seated in wheelchair at sink this session with set up A for UB self care. Pt engaged in lateral leans over onto bed for OT to assist with peri care. Pt continues to be unable to reach secondary to shorter arms and body habitus. Pt utilized reacher in order to thread clothing onto feet and leaning laterally to pull over B hips but needing assistance with this task. OT and pt discussing her progress since last week and developing a plan for next OT session. Pt remained seated in wheelchair with call bell and all needed items within reach.   Therapy Documentation Precautions:  Restrictions Weight Bearing Restrictions: Yes RLE Weight Bearing: Non weight bearing LLE Weight Bearing: Non weight bearing  See Function Navigator for Current Functional Status.   Therapy/Group: Individual Therapy  Lowella Gripittman, Wilburn Keir L 02/29/2016, 8:29 PM

## 2016-02-29 NOTE — Progress Notes (Signed)
Speech Language Pathology Daily Session Note  Patient Details  Name: Priscilla MaduroSusan Jacobs MRN: [id number] Date of Birth: 13-Jul-1975  Today's Date: 02/29/2016 SLP Individual Time: 0730-0827 SLP Individual Time Calculation (min): 57 min   Short Term Goals: Week 1: SLP Short Term Goal 1 (Week 1): (P) Patient will recall new, daily information with Min A verbal and question cues.  SLP Short Term Goal 2 (Week 1): (P) Patient will demonstrate functional problem solving for mildly complex tasks with Min A verbal cues.  SLP Short Term Goal 3 (Week 1): Patient will self-monitor and correct errors with functional tasks with Min A question and verbal cues.   Skilled Therapeutic Interventions:Skilled therapy intervention focused on cognitive goals. Patient was Mod I for problem solving in a complex scheduling task begun in previous session. Given Min A verbal cues, patient was able to recall rules and instructions while engaged in a memory card task. Patient independently retrieved recently encoded information with 100% accuracy during the card task. When asked to retrieve information encoded and identified by the task partner, patient's recall was 100% accurate given verbal and question cues x2 for an overall Min A. Patient independently demonstrated selective attention to card task for two 15-minute intervals. While consuming breakfast tray, patient independently demonstrated divided attention between eating and speaking with the therapist. In conversation patient independently expressed anticipatory awareness of her health and mobility requirements after discharge. Patient left upright in bed with bed alarm on and all needs within reach. Continue current plan of care.    Function:  Cognition Comprehension Comprehension assist level: Understands complex 90% of the time/cues 10% of the time  Expression   Expression assist level: Expresses complex 90% of the time/cues < 10% of the time  Social Interaction Social  Interaction assist level: Interacts appropriately with others with medication or extra time (anti-anxiety, antidepressant).  Problem Solving Problem solving assist level: Solves complex problems: Recognizes & self-corrects  Memory Memory assist level: Recognizes or recalls 90% of the time/requires cueing < 10% of the time    Pain Pain Assessment Pain Assessment: No/denies pain  Therapy/Group: Individual Therapy  Caryn Sectionlison Shalom Ware 02/29/2016, 2:48 PM
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
## 2016-03-01 ENCOUNTER — Inpatient Hospital Stay (HOSPITAL_COMMUNITY): Payer: Medicaid Other | Admitting: Occupational Therapy

## 2016-03-01 ENCOUNTER — Inpatient Hospital Stay (HOSPITAL_COMMUNITY): Payer: Medicaid Other | Admitting: Speech Pathology

## 2016-03-01 ENCOUNTER — Inpatient Hospital Stay (HOSPITAL_COMMUNITY): Payer: Self-pay | Admitting: Physical Therapy

## 2016-03-01 MED ORDER — TRAMADOL HCL 50 MG PO TABS
100.0000 mg | ORAL_TABLET | Freq: Four times a day (QID) | ORAL | Status: DC | PRN
  Administered 2016-03-02 – 2016-03-05 (×5): 100 mg via ORAL
  Filled 2016-03-01 (×6): qty 2

## 2016-03-01 NOTE — Progress Notes (Signed)
Recreational Therapy Session Note  Patient Details  Name: Priscilla Jacobs MRN: [id number] Date of Birth: 10-Aug-1975 Today's Date: 03/01/2016  Pain: no c/o Skilled Therapeutic Interventions/Progress Updates: Session focused on discharge planning, use of leisure time with activity modifications.  Pt smiling this session and stating that she is in a better place mentally than last week when I saw her.  Pt is focused on regaining further independence but is anxious to return home to her family.  Therapy/Group: Individual Therapy   Malini Flemings 03/01/2016, 4:00 PM

## 2016-03-01 NOTE — Plan of Care (Signed)
Problem: RH Bathing Goal: LTG Patient will bathe with assist, cues/equipment (OT) LTG: Patient will bathe specified number of body parts with assist with/without cues using equipment (position)  (OT)  Downgraded secondary to body habitus   Problem: RH Toileting Goal: LTG Patient will perform toileting w/assist, cues/equip (OT) LTG: Patient will perform toiletiing (clothes management/hygiene) with assist, with/without cues using equipment (OT)  Downgraded secondary to body habitus

## 2016-03-01 NOTE — Progress Notes (Signed)
Physical Therapy Weekly Progress Note  Patient Details  Name: Priscilla Jacobs MRN: [id number] Date of Birth: 03-Jul-1975  Beginning of progress report period: February 23, 2016 End of progress report period: March 01, 2016  Today's Date: 03/01/2016 PT Individual Time: 6599-3570 PT Individual Time Calculation (min): 60 min    Patient has made good progress and has met 5 of 5 short term goals.  Pt is currently supervision for bed mobility from flat mat and use of hospital bed controls, supervision for bed <> chair A/P transfers, and supervision w/c mobility on level surfaces.    Patient continues to demonstrate the following deficits: impaired strength, impaired activity tolerance/endurance, pain, anxiety with mobility, impaired balance, impaired sequencing and therefore will continue to benefit from skilled PT intervention to enhance overall performance with activity tolerance, balance, ability to compensate for deficits and functional use of  right lower extremity and left lower extremity NWB in more dependent position for transfers.  Patient progressing toward long term goals..  Continue plan of care.  PT Short Term Goals Week 1:  PT Short Term Goal 1 (Week 1): Pt will perform bed mobility on flat bed with supervision PT Short Term Goal 1 - Progress (Week 1): Met PT Short Term Goal 2 (Week 1): Pt will perform bed <> chair transfers A/P with supervision and verbalizing 50% of transfer PT Short Term Goal 2 - Progress (Week 1): Met PT Short Term Goal 3 (Week 1): Pt will engage in sitting balance and strengthening activities with LE down to 45 deg supported x 10 minutes PT Short Term Goal 3 - Progress (Week 1): Met PT Short Term Goal 4 (Week 1): Pt will perform w/c mobility x 150' supervision and up/down ramp with mod A PT Short Term Goal 4 - Progress (Week 1): Met PT Short Term Goal 5 (Week 1): Pt will perform simulated car transfer (back seat) with min A PT Short Term Goal 5 - Progress  (Week 1): Met Week 2:  PT Short Term Goal 1 (Week 2): = LTG with D/C at end of week   Skilled Therapeutic Interventions/Progress Updates:  Pt received in bed; requesting to just don extra gown due to bath later today.  Pt performed rolling in bed with rails Mod I to don underwear and performed supine > sit with rails and posterior transfer into w/c with supervision and therapist performing set up and maintaining placement of w/c during transfer.  Pt able to back w/c up and position legs on leg rests (once donned by therapist due to pt unable to reach down that far) with supervision.  Performed w/c mobility x 150' with supervision with LE lowered below 45 deg.  In gym pt able to rest LE all the way into flexion to remove leg rests and then position self for transfer onto mat.  Pt performed A/P transfer onto mat and sat at edge of mat for LE and UE strengthening with LE in full flexion x 10-15 minutes.  Also continued discussion with pt about her concerns and questions regarding equipment (will her hospital bed come with twin size sheets) and f/u therapy (pt only allowed 10 [place] visits).  Will investigate community resources for f/u therapy and will discuss with Education officer, museum.  Pt measured for w/c for home but STF height undetermined until height of car known; will schedule actual car transfer with daughter for later in the week to prepare for D/C and determine if pt needs slideboard.  At end of session pt returned  to room and left with LE lowered on ELR and all items within reach.  Therapy Documentation Precautions:  Restrictions Weight Bearing Restrictions: Yes RLE Weight Bearing: Non weight bearing LLE Weight Bearing: Non weight bearing Pain:  minimal c/o pain  See Function Navigator for Current Functional Status.  Therapy/Group: Individual Therapy  Raylene Everts Faucette 03/01/2016, 12:30 PM

## 2016-03-01 NOTE — Progress Notes (Signed)
Speech Language Pathology Daily Session Notes  Patient Details  Name: Priscilla Jacobs MRN: [id number] Date of Birth: May 19, 1976  Today's Date: 03/01/2016  Session 1 SLP Individual Time: 1400-1430 SLP Individual Time Calculation (min): 30 min Session 2 SLP Individual Time: 1530-1600  SLP Individual Time Calculation (min): 30 min   Short Term Goals: Week 1: SLP Short Term Goal 1 (Week 1): (P) Patient will recall new, daily information with Min A verbal and question cues.  SLP Short Term Goal 2 (Week 1): (P) Patient will demonstrate functional problem solving for mildly complex tasks with Min A verbal cues.  SLP Short Term Goal 3 (Week 1): Patient will self-monitor and correct errors with functional tasks with Min A question and verbal cues.   Skilled Therapeutic Interventions:   Session 1 Skilled treatment session focused on addressing cognition goals. SLP facilitated session by providing a structured conversation for recall of daily information/therapy precautions/restrictions.  Patient reported some initial difficulty with recall of new information, but acknowledged that repetition has helped her carryover the information.  She reports improved ability to focus which has helped with memory and problem solving.  SLP provided 4 places/itmes that SLP will require her to locate in second session later today.  Continue with current plan of care.   Session 2 Skilled treatment session focused on addressing cognition goals. SLP facilitated session by providing Min verbal, question cues to recall 4 previously given places/items to locate.  Patient required increased time to accurately locate and complete the scavenger hunt.  Continue with current plan of care.  Function:  Cognition Comprehension Comprehension assist level: Understands complex 90% of the time/cues 10% of the time  Expression   Expression assist level: Expresses complex 90% of the time/cues < 10% of the time  Social Interaction  Social Interaction assist level: Interacts appropriately with others with medication or extra time (anti-anxiety, antidepressant).  Problem Solving Problem solving assist level: Solves complex problems: Recognizes & self-corrects  Memory Memory assist level: Recognizes or recalls 90% of the time/requires cueing < 10% of the time    Pain Pain Assessment Pain Assessment: No/denies pain x2  Therapy/Group: Individual Therapy x2  Priscilla Jacobs, M.A., CCC-SLP [phone]  Priscilla Jacobs 03/01/2016, 3:20 PM

## 2016-03-01 NOTE — Progress Notes (Signed)
Occupational Therapy Session Note  Patient Details  Name: Priscilla Jacobs MRN: [id number] Date of Birth: 1975/10/30  Today's Date: 03/01/2016 OT Individual Time: 1300-1358 and 1500-1530 OT Individual Time Calculation (min): 58 min and 30 min     Short Term Goals: Week 1:  OT Short Term Goal 1 (Week 1): Pt will perform toilet transfer with min A in order to decrease level of assist with self care.  OT Short Term Goal 2 (Week 1): Pt will perform clothing management for toileting task with min A.  OT Short Term Goal 3 (Week 1): Pt will perform LB dressing with mod A in order to decrease level of assist needed. OT Short Term Goal 4 (Week 1): Pt will perform bathing tasks with min A in order to increase I with self care.  Skilled Therapeutic Interventions/Progress Updates:    Session 1: Upon entering the room, pt seated in wheelchair with 4/10 c/o pain in B LE's but agreeable to OT intervention. Pt performed anterior posterior transfer onto bed from wheelchair with supervision. Pt needing set up of drop arm commode chair and transferred onto commode to urinate and have BM. Pt began bathing while seated on drop arm commode chair with set up A. OT educating pt on use of toilet aid in order to increase pt I with hygiene. Pt able to perform peri care but needing assistance for throughness for hygiene after BM. Pt is motivated by using AE and success she is having. Pt dressing with use of reacher and set up A to obtain clothing items. Pt returns to wheelchair at end of session in same manner as above. Call bell and all needed items within reach upon exiting the room.   Session 2: Pt seated in wheelchair upon entering the room. OT initiated  BUE HEP with use of green, level 3 theraband this session. OT demonstrated each exercise and referred to written handout. Pt returned demonstrations with min verbal cues. Pt performed 3 sets of 10 of each exercise. Pt remaining seated in wheelchair at end of session. Call  bell and all needed items within reach.  Therapy Documentation Precautions:  Restrictions Weight Bearing Restrictions: Yes RLE Weight Bearing: Non weight bearing LLE Weight Bearing: Non weight bearing  See Function Navigator for Current Functional Status.   Therapy/Group: Individual Therapy  Lowella Gripittman, Reighlynn Swiney L 03/01/2016, 2:43 PM
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
## 2016-03-02 ENCOUNTER — Inpatient Hospital Stay (HOSPITAL_COMMUNITY): Payer: Medicaid Other | Admitting: Speech Pathology

## 2016-03-02 ENCOUNTER — Inpatient Hospital Stay (HOSPITAL_COMMUNITY): Payer: Medicaid Other | Admitting: Occupational Therapy

## 2016-03-02 ENCOUNTER — Inpatient Hospital Stay (HOSPITAL_COMMUNITY): Payer: Self-pay | Admitting: Physical Therapy

## 2016-03-02 ENCOUNTER — Inpatient Hospital Stay (HOSPITAL_COMMUNITY): Payer: Medicaid Other | Admitting: *Deleted

## 2016-03-02 MED ORDER — OXYCODONE HCL 5 MG PO TABS
5.0000 mg | ORAL_TABLET | ORAL | Status: DC | PRN
  Administered 2016-03-02: 5 mg via ORAL
  Administered 2016-03-03 (×2): 10 mg via ORAL
  Filled 2016-03-02 (×2): qty 2
  Filled 2016-03-02: qty 1

## 2016-03-02 NOTE — Progress Notes (Signed)
Social Work Patient ID: Priscilla Jacobs, female   DOB: [birth year], 40 y.o.   MRN: [id number]  Met with pt today to review team conference and d/c planning arrangements.  Pt with brighter affect but does report that she is "nervous" about discharge and riding in the car. She is scheduled to meet again with Dr. Beverly Gust tomorrow. Have explained to pt that she does not have a "qualifying diagnosis" under [place] Medicaid to receive follow up therapies.  She is very disappointed with this but has been provided with information on pro bono clinic in [facility]. Have also confirmed that the [facility] medical clinic, which is her medical "home", can also provide some physical therapy.  [facility] is a [facility] free clinic).   Have placed order for all of pt's DME needs and have connected her with the [facility] medication assistance program to use at d/c.  [facility] has onsite pharmacy as well.).  Pt aware that I will be off campus the next two days.  My co-workers will cover if any further concerns arise.  Nyemah Watton, LCSW

## 2016-03-02 NOTE — Discharge Instructions (Signed)
Inpatient Rehab Discharge Instructions  Molly MaduroSusan Dulski Discharge date and time: No discharge date for patient encounter.   Activities/Precautions/ Functional Status: Activity: Nonweightbearing bilateral lower extremities Diet: regular diet Wound Care: keep wound clean and dry Functional status:  ___ No restrictions     ___ Walk up steps independently ___ 24/7 supervision/assistance   ___ Walk up steps with assistance ___ Intermittent supervision/assistance  ___ Bathe/dress independently ___ Walk with walker     _x__ Bathe/dress with assistance ___ Walk Independently    ___ Shower independently ___ Walk with assistance    ___ Shower with assistance ___ No alcohol     ___ Return to work/school ________   COMMUNITY REFERRALS UPON DISCHARGE:    Medical Equipment/Items Ordered:  Wheelchair, cushion, hospital bed, drop arm commode and transfer board                                                      Agency/Supplier:  [facility] @ [phone]     Special Instructions: Follow-up with Dr. Victorino DikeHewitt [phone] week of October 3 for removal of splints and sutures   My questions have been answered and I understand these instructions. I will adhere to these goals and the provided educational materials after my discharge from the hospital.  Patient/Caregiver Signature _______________________________ Date __________  Clinician Signature _______________________________________ Date __________  Please bring this form and your medication list with you to all your follow-up doctor's appointments.
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
## 2016-03-02 NOTE — Progress Notes (Signed)
Occupational Therapy Session Note  Patient Details  Name: Priscilla Jacobs MRN: [id number] Date of Birth: 09/17/1975  Today's Date: 03/02/2016 OT Individual Time: 1305-1350 OT Individual Time Calculation (min): 45 min     Short Term Goals:Week 1:  OT Short Term Goal 1 (Week 1): Pt will perform toilet transfer with min A in order to decrease level of assist with self care.  OT Short Term Goal 2 (Week 1): Pt will perform clothing management for toileting task with min A.  OT Short Term Goal 3 (Week 1): Pt will perform LB dressing with mod A in order to decrease level of assist needed. OT Short Term Goal 4 (Week 1): Pt will perform bathing tasks with min A in order to increase I with self care.     Skilled Therapeutic Interventions/Progress Updates:    Pt seen for skilled OT to facilitate functional mobility with bed mobility and transfer skills needed for ADLs. Pt received in w/c. Completed A/P transfers w/c >< bed, bed>< BSC with set up A only. Bathed and toileted seated on BSC and then returned to bed to complete washing perineal area and donning clothing with reacher. Pt with improving activity tolerance as she only needed a few short breaks. Discussed discharge plans and procedures for home. Pt in room in w/c with all needs met.  Therapy Documentation Precautions:  Restrictions Weight Bearing Restrictions: Yes RLE Weight Bearing: Non weight bearing LLE Weight Bearing: Non weight bearing   Pain: Pain Assessment Pain Assessment: No/denies pain Pain Score: 4  Pain Type: Surgical pain Pain Location: Leg Pain Orientation: Right;Left Pain Descriptors / Indicators: Pressure Pain Onset: Progressive Patients Stated Pain Goal: 3 Pain Intervention(s): Medication (See eMAR) ADL:  See Function Navigator for Current Functional Status.   Therapy/Group: Individual Therapy  Priscilla Jacobs 03/02/2016, 1:14 PM
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
## 2016-03-02 NOTE — Progress Notes (Signed)
Speech Language Pathology Daily Session Note  Patient Details  Name: Priscilla MaduroSusan Jacobs MRN: [id number] Date of Birth: 05/08/1976  Today's Date: 03/02/2016 SLP Individual Time: 0730-0757 SLP Individual Time Calculation (min): 27 min   Short Term Goals: Week 1: SLP Short Term Goal 1 (Week 1): (P) Patient will recall new, daily information with Min A verbal and question cues.  SLP Short Term Goal 2 (Week 1): (P) Patient will demonstrate functional problem solving for mildly complex tasks with Min A verbal cues.  SLP Short Term Goal 3 (Week 1): Patient will self-monitor and correct errors with functional tasks with Min A question and verbal cues.   Skilled Therapeutic Interventions: Skilled therapy intervention focused on cognitive goals. Given Min A question cues, patient problem solved for physical and cognitive compensatory strategies needed after discharge to maximize safety and independence in familiar tasks at home. Patient independently familiar with recovery timeline and using a memory book to independently maximize recall. Patient independently utilized divided attention between two conversation partners while maintaining topic relevance. Patient expressed interest in learning more compensatory memory strategies prior to discharge. Patient left upright in bed with all needs within reach. Continue current plan of care.    Function:  Cognition Comprehension Comprehension assist level: Understands complex 90% of the time/cues 10% of the time  Expression   Expression assist level: Expresses complex 90% of the time/cues < 10% of the time  Social Interaction Social Interaction assist level: Interacts appropriately with others with medication or extra time (anti-anxiety, antidepressant).  Problem Solving Problem solving assist level: Solves complex problems: With extra time  Memory Memory assist level: Recognizes or recalls 90% of the time/requires cueing < 10% of the time    Pain Pain  Assessment Pain Assessment: No/denies pain  Therapy/Group: Individual Therapy  [name] 03/02/2016, 3:29 PM
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
## 2016-03-03 ENCOUNTER — Inpatient Hospital Stay (HOSPITAL_COMMUNITY): Payer: Medicaid Other | Admitting: Speech Pathology

## 2016-03-03 ENCOUNTER — Ambulatory Visit (HOSPITAL_COMMUNITY): Payer: Self-pay | Admitting: Physical Therapy

## 2016-03-03 ENCOUNTER — Inpatient Hospital Stay (HOSPITAL_COMMUNITY): Payer: Medicaid Other | Admitting: Occupational Therapy

## 2016-03-03 ENCOUNTER — Encounter (HOSPITAL_COMMUNITY): Payer: Self-pay

## 2016-03-03 NOTE — Progress Notes (Signed)
Speech Language Pathology Daily Session Note  Patient Details  Name: Priscilla Jacobs MRN: [id number] Date of Birth: 1975-10-05  Today's Date: 03/03/2016 SLP Individual Time: [id number] SLP Individual Time Calculation (min): 57 min   Short Term Goals: Week 1: SLP Short Term Goal 1 (Week 1): (P) Patient will recall new, daily information with Min A verbal and question cues.  SLP Short Term Goal 2 (Week 1): (P) Patient will demonstrate functional problem solving for mildly complex tasks with Min A verbal cues.  SLP Short Term Goal 3 (Week 1): Patient will self-monitor and correct errors with functional tasks with Min A question and verbal cues.   Skilled Therapeutic Interventions:Skilled therapy intervention focused on cognitive goals, specifically encoding and retrieval. Given a list of memory strategies, patient demonstrated anticipatory awareness of how she could use those strategies at home given supervision. Patient independently requested use of external aid organization notebook, added the memory strategy sheet, and wrote down additional memory strategies covered in the session. Patient engaged in storage and retrieval task with Mod I. Patient left reclined in bed with all needs within reach. Continue current plan of care.    Function:  Cognition Comprehension Comprehension assist level: Understands complex 90% of the time/cues 10% of the time  Expression   Expression assist level: Expresses complex 90% of the time/cues < 10% of the time  Social Interaction Social Interaction assist level: Interacts appropriately with others with medication or extra time (anti-anxiety, antidepressant).  Problem Solving Problem solving assist level: Solves complex problems: With extra time  Memory Memory assist level: Assistive device: No helper    Pain Pain Assessment Pain Assessment: No/denies pain  Therapy/Group: Individual Therapy  Caryn Sectionlison Dakari Cregger 03/03/2016, 12:26 PM

## 2016-03-03 NOTE — Progress Notes (Signed)
Speech Language Pathology Daily Session Note  Patient Details  Name: Priscilla MaduroSusan Skiff MRN: [id number] Date of Birth: 10-04-1975  Today's Date: 03/03/2016 SLP Individual Time: 1500-1530 SLP Individual Time Calculation (min): 30 min   Short Term Goals: Week 1: SLP Short Term Goal 1 (Week 1): (P) Patient will recall new, daily information with Min A verbal and question cues.  SLP Short Term Goal 2 (Week 1): (P) Patient will demonstrate functional problem solving for mildly complex tasks with Min A verbal cues.  SLP Short Term Goal 3 (Week 1): Patient will self-monitor and correct errors with functional tasks with Min A question and verbal cues.   Skilled Therapeutic Interventions:Skilled therapy intervention focused on cognitive goals. Pt able to recall memory strategies utilized earlier this date at mod I. Pt able to discuss return home with appropriate arrangements for safety awareness. Pt requested medication review prior to discharge home.    Function:  Eating Eating                 Cognition Comprehension Comprehension assist level: Understands complex 90% of the time/cues 10% of the time  Expression   Expression assist level: Expresses complex 90% of the time/cues < 10% of the time  Social Interaction Social Interaction assist level: Interacts appropriately with others with medication or extra time (anti-anxiety, antidepressant).  Problem Solving Problem solving assist level: Solves complex problems: With extra time  Memory Memory assist level: Assistive device: No helper    Pain Pain Assessment Pain Assessment: No/denies pain  Therapy/Group: Individual Therapy  Rocky CraftsKara E Joshoa Shawler MA, CCC-SLP 03/03/2016, 4:11 PM

## 2016-03-03 NOTE — Progress Notes (Signed)
Physical Therapy Session Note  Patient Details  Name: Priscilla MaduroSusan Putzier MRN: [id number] Date of Birth: [birth year]  Today's Date: 03/03/2016 PT Individual Time: [id number] PT Individual Time Calculation (min): 64 min   Short Term Goals: Week 2:  PT Short Term Goal 1 (Week 2): = LTG with D/C at end of week  Skilled Therapeutic Interventions/Progress Updates:  Pt received in w/c with family present to go over actual car transfer.  Pt demonstrated donning of shorts under gown with reacher and lateral leans with assistance for set up for leaning from w/c.  Downstairs verbalized to pt and family how to perform set up of w/c, slideboard and perform anterior/posterior transfer w/c <> back seat of car.  Pt return demonstrated with therapist's supervision and verbal cues for sequencing and then return demonstrated with family providing safe set up and supervision.  Also demonstrated to husband how to break down and set up w/c.  Returned upstairs and pt and husband performed Mod I anterior/posterior transfers w/c <> mat.  Pt demonstrated all exercises in HEP to husband and daughter.  At end of session pt left in w/c with family present and awaiting OT session.  Therapy Documentation Precautions:  Restrictions Weight Bearing Restrictions: Yes RLE Weight Bearing: Non weight bearing LLE Weight Bearing: Non weight bearing Vital Signs: Therapy Vitals Temp: 98.1 F (36.7 C) Temp Source: Oral Pulse Rate: (!) 106 Resp: 17 BP: 134/73 Patient Position (if appropriate): Sitting Oxygen Therapy SpO2: 98 % O2 Device: Not Delivered Pain: Pain Assessment Pain Assessment: No/denies pain   See Function Navigator for Current Functional Status.   Therapy/Group: Individual Therapy  Edman CircleHall, Audra [facility] CenterFaucette 03/03/2016, 4:15 PM
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
## 2016-03-03 NOTE — Progress Notes (Signed)
Recreational Therapy Session Note  Patient Details  Name: Priscilla Jacobs MRN: [id number] Date of Birth: [birth year] Today's Date: 03/03/2016 Late entry from 03/02/16 Pain: no c/o Skilled Therapeutic Interventions/Progress Updates: Pt emotional/tearful/angry after learning that she did not qualify for follow therapy.  Pt requesting to go outside for diversion and coping.  Discussion outside about potential clinics in the area to seek services.  Pt appreciative of visit and information.    Therapy/Group: Individual Therapy   Kerron Sedano 03/03/2016, 12:22 PM

## 2016-03-03 NOTE — Progress Notes (Signed)
Occupational Therapy Session Note  Patient Details  Name: Priscilla MaduroSusan Kasperski MRN: [id number] Date of Birth: [birth year]  Today's Date: 03/03/2016 OT Individual Time: 1415-1500 OT Individual Time Calculation (min): 45 min     Short Term Goals:Week 1:  OT Short Term Goal 1 (Week 1): Pt will perform toilet transfer with min A in order to decrease level of assist with self care.  OT Short Term Goal 2 (Week 1): Pt will perform clothing management for toileting task with min A.  OT Short Term Goal 3 (Week 1): Pt will perform LB dressing with mod A in order to decrease level of assist needed. OT Short Term Goal 4 (Week 1): Pt will perform bathing tasks with min A in order to increase I with self care.  Skilled Therapeutic Interventions/Progress Updates:    Pt seen for OT ADL bathing/dressing session. Pt sitting up in w/c upon arrival with daughter and husband present. Pt voicing desire for bathing task this afternoon as well as need for toileting. She declined attempting anterior/posterior transfer from w/c <> BSC, opting to transfer w/c>bed>BSC due to "that's how I've always done it".  All A/P transfers completed with set-up assist.  She bathed seated on 3-1 BSC, requiring assist for buttock hygiene. Attempted lateral lean into w/c placed to the side, however, due to body habitus pt still unable to reach buttock for hygiene. She transferred back to bed to complete LB dressing with set-up using reacher.  Pt returned to w/c at end of session, left set-up with all needs in reach. Pt's LEs lowered slightly on elevating leg rests as pt motivated to begin tolerating non-dependent positioning.  Pt feeling anxious about planned d/c home on Saturday though feels better now that her husband has seen bathing/dressing routine. Educated regarding importance of participation/ independence with ADLs in building activity tolerance and decreasing caregiver burden.   Therapy Documentation Precautions:  Restrictions Weight  Bearing Restrictions: Yes RLE Weight Bearing: Non weight bearing LLE Weight Bearing: Non weight bearing Pain:   No/ denies pain  See Function Navigator for Current Functional Status.   Therapy/Group: Individual Therapy  Lewis, Kameren Pargas C 03/03/2016, 7:15 AM

## 2016-03-03 NOTE — Progress Notes (Signed)
Recreational Therapy Discharge Summary Patient Details  Name: Priscilla Jacobs MRN: [id number] Date of Birth: 1976/05/01 Today's Date: 03/03/2016  Long term goals set: 1  Long term goals met: 1  Comments on progress toward goals: Pt has made great progress toward goal and is ready for discharge home at set up/supervision-Mod I  Level for simple TR tasks.  Pt continues to be limited by anxiety and lack of control due to physical limitations, but states she is coping better now than previously during LOS.  Education provided on activity analysis with potential leisure modifications & energy conservation.  Also discussed use of journalling to assist with coping, keeping her focused on progress vs. Limitations.  Pt is dischargeing home with family 9/30.   Reasons for discharge: discharge from hospital   Patient/family agrees with progress made and goals achieved: Yes  Honest Safranek 03/03/2016, 12:25 PM
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
## 2016-03-04 ENCOUNTER — Inpatient Hospital Stay (HOSPITAL_COMMUNITY): Payer: Self-pay | Admitting: Occupational Therapy

## 2016-03-04 ENCOUNTER — Inpatient Hospital Stay (HOSPITAL_COMMUNITY): Payer: Self-pay | Admitting: Physical Therapy

## 2016-03-04 ENCOUNTER — Inpatient Hospital Stay (HOSPITAL_COMMUNITY): Payer: Medicaid Other | Admitting: Speech Pathology

## 2016-03-04 MED ORDER — POLYETHYLENE GLYCOL 3350 17 G PO PACK: 17.0000 g | PACK | Freq: Two times a day (BID) | ORAL | 0 refills | Status: DC

## 2016-03-04 MED ORDER — METHOCARBAMOL 500 MG PO TABS: 500.0000 mg | ORAL_TABLET | Freq: Four times a day (QID) | ORAL | 0 refills | Status: DC | PRN

## 2016-03-04 MED ORDER — MECLIZINE HCL 12.5 MG PO TABS: 12.5000 mg | ORAL_TABLET | Freq: Two times a day (BID) | ORAL | 0 refills | Status: DC

## 2016-03-04 MED ORDER — TRAMADOL HCL 50 MG PO TABS: 100.0000 mg | ORAL_TABLET | Freq: Four times a day (QID) | ORAL | 0 refills | Status: DC | PRN

## 2016-03-04 MED ORDER — RIVAROXABAN (XARELTO) VTE STARTER PACK (15 & 20 MG): ORAL_TABLET | ORAL | 0 refills | Status: DC

## 2016-03-04 MED ORDER — AMITRIPTYLINE HCL 25 MG PO TABS: 25.0000 mg | ORAL_TABLET | Freq: Every day | ORAL | 1 refills | Status: DC

## 2016-03-04 NOTE — Progress Notes (Signed)
Physical Therapy Discharge Summary  Patient Details  Name: Priscilla Jacobs MRN: [id number] Date of Birth: 10-14-1975  Today's Date: 03/04/2016      Patient has met 8 of 8 long term goals due to improved activity tolerance, improved balance, improved postural control, increased strength, increased range of motion, decreased pain, ability to compensate for deficits and improved sequencing.  Patient to discharge at a wheelchair level Modified Independent for level mobility, bed mobility and A/P transfers if w/c is on carpet (living room); pt prefers supervision-caregiver holding w/c-due to anxiety and fear of w/c sliding but is able to perform Mod I; requires supervision for car transfers and min A for ramp negotiation in w/c.  Patient's care partner is independent to provide the necessary supervision and set up assistance at discharge.  Reasons goals not met: All goals met  Recommendation:  Patient will benefit from ongoing skilled PT services in [facility] physical therapy clinic as pt diagnosis does not qualify her for HHPT visits to continue to advance safe functional mobility, address ongoing impairments in UE and LE strength and ROM once WB status has been upgraded, impaired balance, gait, and minimize fall risk.  Equipment: hospital bed, slideboard, reclining manual w/c with ELR  Reasons for discharge: treatment goals met and discharge from hospital  Patient/family agrees with progress made and goals achieved: Yes  PT Discharge Precautions/Restrictions  NWB bilat LE Vital SignsTherapy Vitals Temp: 97.8 F (36.6 C) Temp Source: Oral Pulse Rate: 84 Resp: 18 BP: 114/68 Patient Position (if appropriate): Lying Oxygen Therapy SpO2: 99 % O2 Device: Not Delivered Pain   Sensation Sensation Light Touch: Appears Intact Stereognosis: Not tested Hot/Cold: Not tested Proprioception: Appears Intact Coordination Gross Motor Movements are Fluid and Coordinated: Not tested Fine Motor  Movements are Fluid and Coordinated: Not tested Motor  Motor Motor: Within Functional Limits  Mobility Bed Mobility Bed Mobility: Supine to Sit;Sit to Supine Supine to Sit: 6: Modified independent (Device/Increase time) Sit to Supine: 6: Modified independent (Device/Increase time) Transfers Anterior-Posterior Transfer: 6: Modified independent (Device/Increase time);5: Supervision Anterior-Posterior Transfer Details (indicate cue type and reason): Requires supervision for ant/post transfers when w/c is on slick floor to maintain placement of w/c during transfers and during car transfer to back seat with slideboard; pt can perform Mod I when w/c on carpet (her living room is carpeted); pt also prefers w/c to be held due to anxiety  Locomotion  Ambulation Ambulation: No Gait Gait: No Stairs / Additional Locomotion Stairs: No Ramp: 4: Min assist (w/c level) Product manager Assistance: 6: Modified independent (Device/Increase time) Environmental health practitioner: Both upper extremities Wheelchair Parts Management: Independent Distance: 150  Trunk/Postural Assessment  Cervical Assessment Cervical Assessment: Within Functional Limits Thoracic Assessment Thoracic Assessment: Within Functional Limits Lumbar Assessment Lumbar Assessment: Within Functional Limits Postural Control Postural Control:  (difficulty with forward weight shifting)  Balance Static Sitting Balance Static Sitting - Balance Support: Feet unsupported;No upper extremity supported Static Sitting - Level of Assistance: 6: Modified independent (Device/Increase time) Dynamic Sitting Balance Dynamic Sitting - Balance Support: Feet unsupported Dynamic Sitting - Level of Assistance: 6: Modified independent (Device/Increase time) Extremity Assessment      RLE Assessment RLE Assessment: Exceptions to [facility] (4/5 hip and knee; lower leg and ankle in cast) LLE Assessment LLE Assessment: Exceptions to [facility] (4/5 hip and  knee; lower leg and ankle in cast)   See Function Navigator for Current Functional Status.  Raylene Everts Faucette 03/04/2016, 5:53 AM

## 2016-03-04 NOTE — Progress Notes (Signed)
Occupational Therapy Discharge Summary  Patient Details  Name: Priscilla Jacobs MRN: [id number] Date of Birth: 03-20-1976  Today's Date: 03/04/2016 OT Individual Time:  - 8250-5397 and [id number]  OT Treatment Time: 60 minutes and 32 minutes      Patient has met 10 of 10 long term goals due to improved activity tolerance, postural control, ability to compensate for deficits and improved awareness.  Patient to discharge at overall Modified Independent level.  Patient's care partner is independent to provide the necessary physical assistance at discharge.    Recommendation:  Patient will benefit from ongoing skilled OT services in No further OT services required to continue to advance functional skills in the area of BADL.  Equipment: hospital bed, slideboard, drop arm commode, reclining manual w/c with ELR  Reasons for discharge: treatment goals met  Patient/family agrees with progress made and goals achieved: Yes   Skilled OT Intervention: Pt was seen for session this morning for education on grad day, d/c planning, and reassessment of ADL levels. Pt was eating breakfast upon OT arrival and was provided education on grad day and d/c tomorrow. Pt verbalized understanding and reported having all DME needs taken care of.  After breakfast pt reported having to use the bathroom. AP transfer completed at Mod I level to drop arm commode. Toileting completed with assistance for pericare only. Pt transferred back to bed to complete bathing and dressing with use of AE as needed. LH sponge was utilized for pericare and washing back. Pt completed ADLs with setup though afterwards demonstrated ability to retrieve clothing items for dressing with instruction on use of reacher. Oral care/grooming completed at sink at Mod I level with leg rests lowered per pt comfort to improve tolerance of lower positioning. At end of session pt was left in w/c with all needs within reach.   2nd Session 1:1 Tx (32 minutes) Pt  participated in skilled OT session focusing on emergent awareness, safety awareness, and w/c kitchen mobility during meal prep task. Pts daughter was present today. Pt self propelled to therapy kitchen and was provided education on w/c placement during item retrieval and overall safety with mobility. Pt-therapist collaboration completed for solution to opening refrigerator. Pt and daughter also verbalized understanding with recommendations for environmental modifications. Pt completed simple meal prep with overall supervision demonstrating good safety awareness and insights into deficits while tolerating non-dependent positioning. Afterwards pt self propelled back to room and was left with daughter and all needs within reach. Pt reported having no further questions regarding d/c tomorrow.    OT Discharge Precautions/Restrictions Precautions Precautions: Fall Restrictions Weight Bearing Restrictions: Yes RLE Weight Bearing: Non weight bearing LLE Weight Bearing: Non weight bearing General   Vital Signs Therapy Vitals Temp: 98 F (36.7 C) Temp Source: Oral Pulse Rate: (!) 102 Resp: 20 BP: 120/81 Patient Position (if appropriate): Sitting Oxygen Therapy SpO2: 97 % O2 Device: Not Delivered Pain No c/o pain during sessions   ADL ADL ADL Comments: Please see functional navigator for ADL status Vision/Perception  Vision- History Baseline Vision/History: No visual deficits Patient Visual Report: No change from baseline Vision- Assessment Vision Assessment?: No apparent visual deficits  Cognition Overall Cognitive Status: Within Functional Limits for tasks assessed Arousal/Alertness: Awake/alert Orientation Level: Oriented X4 Attention: Sustained Sustained Attention: Appears intact Alternating Attention: Appears intact Memory: Appears intact Awareness: Appears intact Problem Solving: Appears intact Safety/Judgment: Appears intact Sensation Sensation Light Touch: Appears  Intact Stereognosis: Not tested Hot/Cold: Not tested Proprioception: Appears Intact Coordination Gross Motor Movements are  Fluid and Coordinated: Yes (during ADL completion ) Fine Motor Movements are Fluid and Coordinated: Yes (During ADL completion) Motor  Motor Motor: Within Functional Limits Mobility     Trunk/Postural Assessment  Cervical Assessment Cervical Assessment: Within Functional Limits Thoracic Assessment Thoracic Assessment: Within Functional Limits Lumbar Assessment Lumbar Assessment: Within Functional Limits Postural Control Postural Control: Within Functional Limits  Balance Balance Balance Assessed: Yes Extremity/Trunk Assessment RUE Assessment RUE Assessment: Within Functional Limits LUE Assessment LUE Assessment: Within Functional Limits   See Function Navigator for Current Functional Status.  Remmie Bembenek A Kerryn Tennant 03/04/2016, 4:52 PM

## 2016-03-04 NOTE — Progress Notes (Signed)
Physical Therapy Session Note  Patient Details  Name: Priscilla MaduroSusan Jeffords MRN: [id number] Date of Birth: 1976-01-30  Today's Date: 03/04/2016 PT Individual Time: 1100-1159 PT Individual Time Calculation (min): 59 min    Short Term Goals: Week 2:  PT Short Term Goal 1 (Week 2): = LTG with D/C at end of week  Skilled Therapeutic Interventions/Progress Updates:     PT instructed patient in Grad day assessment to assess prgoress toward goals.   PT educated patient in Bed mobility including sit<>supine and rolling without cues or assist from PT, sitting balance to place new WC at EOB for AP transfer without assist from PT; patient instructed daughter for proper placement of Bilat ELR following transfer. PT also educated patient in proper set up and break down of reclining back WC.    WC mobility in controlled environment 22500ft x 3, simulated community environments food court x 25050ft; both controlled environments and indoor community [facility] performed without cues form PT, Ramp to parking deck x 12800ft , concrete sidewalk at front of hospital x 27800ft with supervision Assist and min cues for improved anterior weight shift to prevent posterior tipping of WC on sloped surface.   Patient returned to room and [facility] reach.    Therapy Documentation Precautions:  Restrictions Weight Bearing Restrictions: Yes RLE Weight Bearing: Non weight bearing LLE Weight Bearing: Non weight bearing  See Function Navigator for Current Functional Status.   Therapy/Group: Individual Therapy  Golden Popustin E Yeni Jiggetts 03/04/2016, 1:13 PM
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
## 2016-03-04 NOTE — Discharge Summary (Signed)
Discharge summary job [id number]

## 2016-03-04 NOTE — Progress Notes (Signed)
Speech Language Pathology Session Note & Discharge Summary  Patient Details  Name: Priscilla Jacobs MRN: [id number] Date of Birth: February 05, 1976  Today's Date: 03/04/2016 SLP Individual Time: 0930-1025 SLP Individual Time Calculation (min): 55 min   Skilled Therapeutic Interventions:  Skilled treatment session focused on cognitive goals. SLP facilitated session by re-administering the MoCA (version 7.3). Patient scored 28/30 points with a score of 26 or above considered norma. Patient demonstrates improvement from last administration on 9/19 in which she scored 24/30 points. Patient's daughter present and educated on patient's current cognitive function, she verbalized understanding. Patient also participated in a complex recall task and recalled 4/4 items with Mod I. Patient left upright in wheelchair with family present and all needs within reach.   Patient has met 3 of 3 long term goals.  Patient to discharge at overall Modified Independent level.   Reasons goals not met: N/A   Clinical Impression/Discharge Summary: Patient has made excellent gains and has met 3 of 3 LTG's this admission due to improved cognitive functioning. Currently, patient is currently Mod I to complete mildly complex, functional and familiar tasks safely in regards to problem solving, recall with use of compensatory strategies and awareness. Patient education is complete and patient will discharge home with assistance from family. Suspect patient is at her cognitive baseline, therefore, skilled f/u SLP services are not warranted at this time.   Care Partner:  Caregiver Able to Provide Assistance: Yes     Recommendation:  None      Equipment: N/A   Reasons for discharge: Treatment goals met;Discharged from hospital   Patient/Family Agrees with Progress Made and Goals Achieved: Yes   Function:  Cognition Comprehension Comprehension assist level: Follows complex conversation/direction with extra time/assistive device   Expression   Expression assist level: Expresses complex ideas: With extra time/assistive device  Social Interaction Social Interaction assist level: Interacts appropriately with others with medication or extra time (anti-anxiety, antidepressant).  Problem Solving Problem solving assist level: Solves complex problems: With extra time  Memory Memory assist level: Assistive device: No helper   Tasnim Balentine 03/04/2016, 11:14 AM

## 2016-03-05 NOTE — Progress Notes (Signed)
Patient discharged home with family at [id number]. [facility] Patient denied any questions. All belongings and discharge instructions sent home with patient.
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
## 2016-03-09 NOTE — Progress Notes (Signed)
Social Work  Discharge Note  The overall goal for the admission was met for:   Discharge location: Yes - home with spouse who will provide 24/7 support  Length of Stay: Yes - 12 days  Discharge activity level: Yes - mod independent at the w/c level  Home/community participation: Yes  Services provided included: MD, RD, PT, OT, SLP, RN, TR, Pharmacy, Neuropsych and SW  Financial Services: Other: NONE  Follow-up services arranged: DME: 20x16 reclining w/c with ELRs, cushion, hospital bed, wide drop arm commode, 30" tf board all via [place] and Patient/Family has no preference for HH/DME agencies  Comments (or additional information):  Pt aware that she does not have a qualifying diagnosis for therapies at home and cannot afford to pay privately.  She has been provided in written information on local pro bono PT program  Patient/Family verbalized understanding of follow-up arrangements: Yes  Individual responsible for coordination of the follow-up plan: pt  Confirmed correct DME delivered: Shigeru Lampert 03/09/2016    [facility] [place]

## 2016-04-07 ENCOUNTER — Encounter: Payer: Self-pay | Attending: Physical Medicine & Rehabilitation | Admitting: Physical Medicine & Rehabilitation

## 2016-06-16 ENCOUNTER — Ambulatory Visit: Payer: Self-pay | Admitting: Physical Therapy

## 2016-06-20 ENCOUNTER — Ambulatory Visit: Payer: Medicaid Other | Attending: Physician Assistant | Admitting: Physical Therapy

## 2016-06-20 DIAGNOSIS — R262 Difficulty in walking, not elsewhere classified: Secondary | ICD-10-CM | POA: Diagnosis present

## 2016-06-20 DIAGNOSIS — M25671 Stiffness of right ankle, not elsewhere classified: Secondary | ICD-10-CM | POA: Insufficient documentation

## 2016-06-20 DIAGNOSIS — M6281 Muscle weakness (generalized): Secondary | ICD-10-CM | POA: Diagnosis present

## 2016-06-20 DIAGNOSIS — M25571 Pain in right ankle and joints of right foot: Secondary | ICD-10-CM | POA: Diagnosis not present

## 2016-06-20 DIAGNOSIS — R2689 Other abnormalities of gait and mobility: Secondary | ICD-10-CM | POA: Diagnosis present

## 2016-06-20 DIAGNOSIS — M25572 Pain in left ankle and joints of left foot: Secondary | ICD-10-CM | POA: Insufficient documentation

## 2016-06-20 DIAGNOSIS — M25672 Stiffness of left ankle, not elsewhere classified: Secondary | ICD-10-CM | POA: Insufficient documentation

## 2016-06-20 NOTE — Patient Instructions (Signed)
  Calf Stretch    With towel around forefoot, keep knee straight and pull back on towel until a stretch is felt in the calf. Hold __30-60__ seconds. Repeat __3__ times. Do __2-3__ sessions per day.     Alphabet    Place pillow under calf so foot does not rub. Moving foot and ankle only, trace the letters of the alphabet from A to Z. Repeat _2-3___ times.     Plantar Flexion With Band    With resistive band around forefoot, press foot down while maintaining resistance with arms. Hold __3-5__ seconds. Repeat __15__ times. Slow and controlled motion.
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
## 2016-06-22 ENCOUNTER — Ambulatory Visit: Payer: Medicaid Other | Admitting: Physical Therapy

## 2016-06-24 ENCOUNTER — Ambulatory Visit: Payer: Medicaid Other | Admitting: Physical Therapy

## 2016-06-27 ENCOUNTER — Ambulatory Visit: Payer: Medicaid Other | Admitting: Physical Therapy

## 2016-06-27 DIAGNOSIS — M25572 Pain in left ankle and joints of left foot: Secondary | ICD-10-CM

## 2016-06-27 DIAGNOSIS — M6281 Muscle weakness (generalized): Secondary | ICD-10-CM

## 2016-06-27 DIAGNOSIS — M25571 Pain in right ankle and joints of right foot: Secondary | ICD-10-CM

## 2016-06-27 DIAGNOSIS — R262 Difficulty in walking, not elsewhere classified: Secondary | ICD-10-CM

## 2016-06-27 DIAGNOSIS — R2689 Other abnormalities of gait and mobility: Secondary | ICD-10-CM

## 2016-06-27 DIAGNOSIS — M25671 Stiffness of right ankle, not elsewhere classified: Secondary | ICD-10-CM

## 2016-06-27 DIAGNOSIS — M25672 Stiffness of left ankle, not elsewhere classified: Secondary | ICD-10-CM

## 2016-06-27 NOTE — Patient Instructions (Signed)
Straight Leg Raise    Bend one leg. Raise other leg __6__ inches with knee locked. Exhale and tighten thigh muscles while raising leg. Repeat with other leg. Repeat __15__ times. Do __2__ sessions per day.   Bridge    Lie back, legs bent. Inhale, pressing hips up. Keeping ribs in, lengthen lower back. Exhale, rolling down along spine from top. Repeat __15__ times. Do __2__ sessions per day.   HIP: Abduction - Side-Lying    Lie on side, legs straight and in line with trunk. Squeeze glutes. Raise top leg up and slightly back. Point toes forward. __15_ reps per set, __2_ sets per day. Bend bottom leg to stabilize pelvis.    Long [employer] Quad    Straighten operated leg and try to hold it __3-5__ seconds. Use __0__ lbs on ankle. Repeat _15___ times. Do ___2_ sessions a day.
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
## 2016-06-30 ENCOUNTER — Ambulatory Visit: Payer: Medicaid Other | Admitting: Physical Therapy

## 2016-07-04 ENCOUNTER — Ambulatory Visit: Payer: Medicaid Other | Admitting: Physical Therapy

## 2016-07-04 DIAGNOSIS — R262 Difficulty in walking, not elsewhere classified: Secondary | ICD-10-CM

## 2016-07-04 DIAGNOSIS — M25572 Pain in left ankle and joints of left foot: Secondary | ICD-10-CM

## 2016-07-04 DIAGNOSIS — R2689 Other abnormalities of gait and mobility: Secondary | ICD-10-CM

## 2016-07-04 DIAGNOSIS — M6281 Muscle weakness (generalized): Secondary | ICD-10-CM

## 2016-07-04 DIAGNOSIS — M25672 Stiffness of left ankle, not elsewhere classified: Secondary | ICD-10-CM

## 2016-07-04 DIAGNOSIS — M25571 Pain in right ankle and joints of right foot: Secondary | ICD-10-CM | POA: Diagnosis not present

## 2016-07-04 DIAGNOSIS — M25671 Stiffness of right ankle, not elsewhere classified: Secondary | ICD-10-CM
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
## 2016-07-07 ENCOUNTER — Ambulatory Visit: Payer: Medicaid Other | Attending: Orthopedic Surgery | Admitting: Physical Therapy

## 2016-07-07 DIAGNOSIS — M25571 Pain in right ankle and joints of right foot: Secondary | ICD-10-CM | POA: Insufficient documentation

## 2016-07-07 DIAGNOSIS — M25672 Stiffness of left ankle, not elsewhere classified: Secondary | ICD-10-CM | POA: Insufficient documentation

## 2016-07-07 DIAGNOSIS — M6281 Muscle weakness (generalized): Secondary | ICD-10-CM

## 2016-07-07 DIAGNOSIS — M25671 Stiffness of right ankle, not elsewhere classified: Secondary | ICD-10-CM | POA: Diagnosis present

## 2016-07-07 DIAGNOSIS — M25572 Pain in left ankle and joints of left foot: Secondary | ICD-10-CM | POA: Insufficient documentation

## 2016-07-07 DIAGNOSIS — R2689 Other abnormalities of gait and mobility: Secondary | ICD-10-CM | POA: Diagnosis present

## 2016-07-07 DIAGNOSIS — R262 Difficulty in walking, not elsewhere classified: Secondary | ICD-10-CM | POA: Insufficient documentation
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
## 2016-07-11 ENCOUNTER — Ambulatory Visit: Payer: Medicaid Other | Admitting: Physical Therapy

## 2016-07-11 DIAGNOSIS — M25571 Pain in right ankle and joints of right foot: Secondary | ICD-10-CM

## 2016-07-11 DIAGNOSIS — R262 Difficulty in walking, not elsewhere classified: Secondary | ICD-10-CM

## 2016-07-11 DIAGNOSIS — M25671 Stiffness of right ankle, not elsewhere classified: Secondary | ICD-10-CM

## 2016-07-11 DIAGNOSIS — M25572 Pain in left ankle and joints of left foot: Secondary | ICD-10-CM

## 2016-07-11 DIAGNOSIS — M6281 Muscle weakness (generalized): Secondary | ICD-10-CM

## 2016-07-11 DIAGNOSIS — R2689 Other abnormalities of gait and mobility: Secondary | ICD-10-CM

## 2016-07-11 DIAGNOSIS — M25672 Stiffness of left ankle, not elsewhere classified: Secondary | ICD-10-CM
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
## 2016-07-14 ENCOUNTER — Ambulatory Visit: Payer: Medicaid Other | Admitting: Physical Therapy

## 2016-07-14 DIAGNOSIS — R262 Difficulty in walking, not elsewhere classified: Secondary | ICD-10-CM

## 2016-07-14 DIAGNOSIS — M25671 Stiffness of right ankle, not elsewhere classified: Secondary | ICD-10-CM

## 2016-07-14 DIAGNOSIS — M25571 Pain in right ankle and joints of right foot: Secondary | ICD-10-CM

## 2016-07-14 DIAGNOSIS — R2689 Other abnormalities of gait and mobility: Secondary | ICD-10-CM

## 2016-07-14 DIAGNOSIS — M25572 Pain in left ankle and joints of left foot: Secondary | ICD-10-CM

## 2016-07-14 DIAGNOSIS — M25672 Stiffness of left ankle, not elsewhere classified: Secondary | ICD-10-CM

## 2016-07-14 DIAGNOSIS — M6281 Muscle weakness (generalized): Secondary | ICD-10-CM
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
## 2016-07-18 ENCOUNTER — Ambulatory Visit: Payer: Medicaid Other | Admitting: Physical Therapy

## 2016-07-18 DIAGNOSIS — M25672 Stiffness of left ankle, not elsewhere classified: Secondary | ICD-10-CM

## 2016-07-18 DIAGNOSIS — R262 Difficulty in walking, not elsewhere classified: Secondary | ICD-10-CM

## 2016-07-18 DIAGNOSIS — M25671 Stiffness of right ankle, not elsewhere classified: Secondary | ICD-10-CM

## 2016-07-18 DIAGNOSIS — M6281 Muscle weakness (generalized): Secondary | ICD-10-CM

## 2016-07-18 DIAGNOSIS — M25571 Pain in right ankle and joints of right foot: Secondary | ICD-10-CM | POA: Diagnosis not present

## 2016-07-18 DIAGNOSIS — R2689 Other abnormalities of gait and mobility: Secondary | ICD-10-CM

## 2016-07-18 DIAGNOSIS — M25572 Pain in left ankle and joints of left foot: Secondary | ICD-10-CM
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
## 2016-07-21 ENCOUNTER — Ambulatory Visit: Payer: Medicaid Other | Admitting: Physical Therapy

## 2016-07-25 ENCOUNTER — Ambulatory Visit: Payer: Medicaid Other | Admitting: Physical Therapy

## 2016-07-28 ENCOUNTER — Ambulatory Visit: Payer: Medicaid Other | Admitting: Physical Therapy

## 2016-08-01 ENCOUNTER — Ambulatory Visit: Payer: Medicaid Other | Admitting: Physical Therapy

## 2016-08-04 ENCOUNTER — Ambulatory Visit: Payer: Medicaid Other | Admitting: Physical Therapy

## 2016-08-08 ENCOUNTER — Ambulatory Visit: Payer: Medicaid Other | Admitting: Physical Therapy

## 2016-08-09 ENCOUNTER — Ambulatory Visit: Payer: Medicaid Other | Attending: Orthopedic Surgery | Admitting: Physical Therapy

## 2016-08-09 DIAGNOSIS — R262 Difficulty in walking, not elsewhere classified: Secondary | ICD-10-CM | POA: Insufficient documentation

## 2016-08-09 DIAGNOSIS — M25572 Pain in left ankle and joints of left foot: Secondary | ICD-10-CM | POA: Diagnosis present

## 2016-08-09 DIAGNOSIS — M25672 Stiffness of left ankle, not elsewhere classified: Secondary | ICD-10-CM | POA: Diagnosis present

## 2016-08-09 DIAGNOSIS — M6281 Muscle weakness (generalized): Secondary | ICD-10-CM | POA: Insufficient documentation

## 2016-08-09 DIAGNOSIS — R2689 Other abnormalities of gait and mobility: Secondary | ICD-10-CM | POA: Insufficient documentation

## 2016-08-09 DIAGNOSIS — M25671 Stiffness of right ankle, not elsewhere classified: Secondary | ICD-10-CM | POA: Diagnosis present

## 2016-08-09 DIAGNOSIS — M25571 Pain in right ankle and joints of right foot: Secondary | ICD-10-CM | POA: Diagnosis present
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
## 2016-08-11 ENCOUNTER — Ambulatory Visit: Payer: Medicaid Other | Admitting: Physical Therapy

## 2016-08-15 ENCOUNTER — Ambulatory Visit: Payer: Medicaid Other | Admitting: Physical Therapy

## 2016-08-17 ENCOUNTER — Ambulatory Visit: Payer: Medicaid Other | Admitting: Physical Therapy

## 2016-08-22 ENCOUNTER — Ambulatory Visit: Payer: Medicaid Other | Admitting: Physical Therapy

## 2016-08-22 DIAGNOSIS — M25572 Pain in left ankle and joints of left foot: Secondary | ICD-10-CM

## 2016-08-22 DIAGNOSIS — R262 Difficulty in walking, not elsewhere classified: Secondary | ICD-10-CM

## 2016-08-22 DIAGNOSIS — M25672 Stiffness of left ankle, not elsewhere classified: Secondary | ICD-10-CM

## 2016-08-22 DIAGNOSIS — M6281 Muscle weakness (generalized): Secondary | ICD-10-CM

## 2016-08-22 DIAGNOSIS — M25571 Pain in right ankle and joints of right foot: Secondary | ICD-10-CM | POA: Diagnosis not present

## 2016-08-22 DIAGNOSIS — R2689 Other abnormalities of gait and mobility: Secondary | ICD-10-CM

## 2016-08-22 DIAGNOSIS — M25671 Stiffness of right ankle, not elsewhere classified: Secondary | ICD-10-CM
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
## 2016-08-24 ENCOUNTER — Ambulatory Visit: Payer: Medicaid Other | Admitting: Physical Therapy

## 2016-08-29 ENCOUNTER — Ambulatory Visit: Payer: Medicaid Other | Admitting: Physical Therapy
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
## 2016-08-31 ENCOUNTER — Ambulatory Visit: Payer: Medicaid Other | Admitting: Physical Therapy

## 2016-09-04 DIAGNOSIS — S92301K Fracture of unspecified metatarsal bone(s), right foot, subsequent encounter for fracture with nonunion: Secondary | ICD-10-CM

## 2016-09-04 HISTORY — DX: Fracture of unspecified metatarsal bone(s), right foot, subsequent encounter for fracture with nonunion: S92.301K

## 2016-09-06 ENCOUNTER — Other Ambulatory Visit: Payer: Self-pay | Admitting: Orthopedic Surgery

## 2016-09-08 ENCOUNTER — Encounter (HOSPITAL_BASED_OUTPATIENT_CLINIC_OR_DEPARTMENT_OTHER): Payer: Self-pay | Admitting: *Deleted

## 2016-09-09 NOTE — Pre-Procedure Instructions (Signed)
To come for anesthesia airway evaluation. 

## 2016-09-09 NOTE — Progress Notes (Signed)
Pt here for weight check and anesthesia consult. Weight=238. Met with Dr Sabra Heck who said [facility] to proceed with scheduled surgery.

## 2016-09-12 ENCOUNTER — Other Ambulatory Visit: Payer: Self-pay | Admitting: Orthopedic Surgery

## 2016-09-15 ENCOUNTER — Encounter (HOSPITAL_BASED_OUTPATIENT_CLINIC_OR_DEPARTMENT_OTHER): Payer: Self-pay | Admitting: Certified Registered"

## 2016-09-15 ENCOUNTER — Ambulatory Visit (HOSPITAL_BASED_OUTPATIENT_CLINIC_OR_DEPARTMENT_OTHER): Payer: Medicaid Other | Admitting: Anesthesiology

## 2016-09-15 ENCOUNTER — Ambulatory Visit (HOSPITAL_BASED_OUTPATIENT_CLINIC_OR_DEPARTMENT_OTHER)
Admission: RE | Admit: 2016-09-15 | Discharge: 2016-09-15 | Disposition: A | Discharge status: Home / Self Care | Payer: Medicaid Other | Source: Ambulatory Visit | Attending: Orthopedic Surgery | Admitting: Orthopedic Surgery

## 2016-09-15 ENCOUNTER — Encounter (HOSPITAL_BASED_OUTPATIENT_CLINIC_OR_DEPARTMENT_OTHER)
Admission: RE | Disposition: A | Discharge status: Home / Self Care | Payer: Self-pay | Source: Ambulatory Visit | Attending: Orthopedic Surgery

## 2016-09-15 DIAGNOSIS — S92301K Fracture of unspecified metatarsal bone(s), right foot, subsequent encounter for fracture with nonunion: Secondary | ICD-10-CM

## 2016-09-15 DIAGNOSIS — X58XXXA Exposure to other specified factors, initial encounter: Secondary | ICD-10-CM | POA: Diagnosis not present

## 2016-09-15 DIAGNOSIS — M899 Disorder of bone, unspecified: Secondary | ICD-10-CM | POA: Diagnosis not present

## 2016-09-15 DIAGNOSIS — Z6841 Body Mass Index (BMI) 40.0 and over, adult: Secondary | ICD-10-CM | POA: Insufficient documentation

## 2016-09-15 DIAGNOSIS — Z8614 Personal history of Methicillin resistant Staphylococcus aureus infection: Secondary | ICD-10-CM | POA: Diagnosis not present

## 2016-09-15 DIAGNOSIS — S92331A Displaced fracture of third metatarsal bone, right foot, initial encounter for closed fracture: Secondary | ICD-10-CM | POA: Insufficient documentation

## 2016-09-15 HISTORY — DX: Meniere's disease, unspecified ear: H81.09

## 2016-09-15 HISTORY — PX: METATARSAL HEAD EXCISION: SHX5027

## 2016-09-15 HISTORY — DX: Personal history of traumatic brain injury: Z87.820

## 2016-09-15 HISTORY — DX: Personal history of other specified conditions: Z87.898

## 2016-09-15 HISTORY — DX: Personal history of Methicillin resistant Staphylococcus aureus infection: Z86.14

## 2016-09-15 HISTORY — DX: Fracture of unspecified metatarsal bone(s), right foot, subsequent encounter for fracture with nonunion: S92.301K

## 2016-09-15 SURGERY — EXCISION, METATARSAL BONE, HEAD
Anesthesia: General | Site: Foot | Laterality: Right

## 2016-09-15 MED ORDER — MIDAZOLAM HCL 2 MG/2ML IJ SOLN
1.0000 mg | INTRAMUSCULAR | Status: DC | PRN
Start: 2016-09-15 — End: 2016-09-15
  Administered 2016-09-15: 2 mg via INTRAVENOUS

## 2016-09-15 MED ORDER — SENNA 8.6 MG PO TABS: 2.0000 | ORAL_TABLET | Freq: Two times a day (BID) | ORAL | 0 refills | Status: DC

## 2016-09-15 MED ORDER — CEFAZOLIN SODIUM-DEXTROSE 2-4 GM/100ML-% IV SOLN
2.0000 g | INTRAVENOUS | Status: AC
  Administered 2016-09-15: 2 g via INTRAVENOUS

## 2016-09-15 MED ORDER — MIDAZOLAM HCL 2 MG/2ML IJ SOLN
INTRAMUSCULAR | Status: AC
  Filled 2016-09-15: qty 2

## 2016-09-15 MED ORDER — PROMETHAZINE HCL 25 MG/ML IJ SOLN
6.2500 mg | INTRAMUSCULAR | Status: DC | PRN
Start: 2016-09-15 — End: 2016-09-15

## 2016-09-15 MED ORDER — LIDOCAINE HCL (CARDIAC) 20 MG/ML IV SOLN
INTRAVENOUS | Status: DC | PRN
  Administered 2016-09-15: 60 mg via INTRAVENOUS

## 2016-09-15 MED ORDER — 0.9 % SODIUM CHLORIDE (POUR BTL) OPTIME
TOPICAL | Status: DC | PRN
  Administered 2016-09-15: 200 mL

## 2016-09-15 MED ORDER — VANCOMYCIN HCL 500 MG IV SOLR
INTRAVENOUS | Status: DC | PRN
  Administered 2016-09-15: 500 mg via TOPICAL

## 2016-09-15 MED ORDER — ONDANSETRON HCL 4 MG/2ML IJ SOLN
INTRAMUSCULAR | Status: DC | PRN
  Administered 2016-09-15: 4 mg via INTRAVENOUS

## 2016-09-15 MED ORDER — SCOPOLAMINE 1 MG/3DAYS TD PT72: 1.0000 | MEDICATED_PATCH | Freq: Once | TRANSDERMAL | Status: DC | PRN

## 2016-09-15 MED ORDER — OXYCODONE HCL 5 MG PO TABS: 5.0000 mg | ORAL_TABLET | ORAL | 0 refills | Status: DC | PRN

## 2016-09-15 MED ORDER — LACTATED RINGERS IV SOLN
INTRAVENOUS | Status: DC
  Administered 2016-09-15 (×2): via INTRAVENOUS

## 2016-09-15 MED ORDER — FENTANYL CITRATE (PF) 100 MCG/2ML IJ SOLN
50.0000 ug | INTRAMUSCULAR | Status: AC | PRN
  Administered 2016-09-15: 50 ug via INTRAVENOUS
  Administered 2016-09-15 (×2): 25 ug via INTRAVENOUS

## 2016-09-15 MED ORDER — FENTANYL CITRATE (PF) 100 MCG/2ML IJ SOLN
INTRAMUSCULAR | Status: AC
  Filled 2016-09-15: qty 2

## 2016-09-15 MED ORDER — LIDOCAINE-EPINEPHRINE (PF) 1 %-1:200000 IJ SOLN
INTRAMUSCULAR | Status: DC | PRN
  Administered 2016-09-15: 10 mL

## 2016-09-15 MED ORDER — DOCUSATE SODIUM 100 MG PO CAPS: 100.0000 mg | ORAL_CAPSULE | Freq: Two times a day (BID) | ORAL | 0 refills | Status: DC

## 2016-09-15 MED ORDER — CHLORHEXIDINE GLUCONATE 4 % EX LIQD: 60.0000 mL | Freq: Once | CUTANEOUS | Status: DC

## 2016-09-15 MED ORDER — HYDROMORPHONE HCL 1 MG/ML IJ SOLN: 0.2500 mg | INTRAMUSCULAR | Status: DC | PRN

## 2016-09-15 MED ORDER — VANCOMYCIN HCL 500 MG IV SOLR
INTRAVENOUS | Status: AC
  Filled 2016-09-15: qty 1000

## 2016-09-15 MED ORDER — CEFAZOLIN SODIUM-DEXTROSE 2-4 GM/100ML-% IV SOLN
INTRAVENOUS | Status: AC
  Filled 2016-09-15: qty 100

## 2016-09-15 MED ORDER — DEXAMETHASONE SODIUM PHOSPHATE 10 MG/ML IJ SOLN
INTRAMUSCULAR | Status: DC | PRN
  Administered 2016-09-15: 10 mg via INTRAVENOUS

## 2016-09-15 MED ORDER — EPHEDRINE 5 MG/ML INJ
INTRAVENOUS | Status: AC
  Filled 2016-09-15: qty 20

## 2016-09-15 MED ORDER — OXYCODONE HCL 5 MG PO TABS
5.0000 mg | ORAL_TABLET | Freq: Once | ORAL | Status: AC
  Administered 2016-09-15: 5 mg via ORAL

## 2016-09-15 MED ORDER — OXYCODONE HCL 5 MG PO TABS
ORAL_TABLET | ORAL | Status: AC
  Filled 2016-09-15: qty 1

## 2016-09-15 MED ORDER — PROPOFOL 10 MG/ML IV BOLUS
INTRAVENOUS | Status: DC | PRN
  Administered 2016-09-15: 200 mg via INTRAVENOUS

## 2016-09-15 MED ORDER — BUPIVACAINE HCL (PF) 0.5 % IJ SOLN
INTRAMUSCULAR | Status: DC | PRN
  Administered 2016-09-15: 10 mL

## 2016-09-15 MED ORDER — SODIUM CHLORIDE 0.9 % IV SOLN: INTRAVENOUS | Status: DC

## 2016-09-15 SURGICAL SUPPLY — 70 items
BANDAGE ESMARK 6X9 LF (GAUZE/BANDAGES/DRESSINGS) IMPLANT
BLADE AVERAGE 25MMX9MM (BLADE)
BLADE AVERAGE 25X9 (BLADE) ×1 IMPLANT
BLADE MINI RND TIP GREEN BEAV (BLADE) IMPLANT
BLADE OSC/SAG .038X5.5 CUT EDG (BLADE) IMPLANT
BLADE SURG 15 STRL LF DISP TIS (BLADE) ×3 IMPLANT
BLADE SURG 15 STRL SS (BLADE) ×6
BNDG CMPR 9X4 STRL LF SNTH (GAUZE/BANDAGES/DRESSINGS)
BNDG CMPR 9X6 STRL LF SNTH (GAUZE/BANDAGES/DRESSINGS)
BNDG COHESIVE 4X5 TAN STRL (GAUZE/BANDAGES/DRESSINGS) ×3 IMPLANT
BNDG CONFORM 3 STRL LF (GAUZE/BANDAGES/DRESSINGS) ×3 IMPLANT
BNDG ESMARK 4X9 LF (GAUZE/BANDAGES/DRESSINGS) IMPLANT
BNDG ESMARK 6X9 LF (GAUZE/BANDAGES/DRESSINGS)
CAP PIN PROTECTOR ORTHO WHT (CAP) ×2 IMPLANT
CHLORAPREP W/TINT 26ML (MISCELLANEOUS) ×3 IMPLANT
COVER BACK TABLE 60X90IN (DRAPES) ×3 IMPLANT
CUFF TOURNIQUET SINGLE 24IN (TOURNIQUET CUFF) ×2 IMPLANT
CUFF TOURNIQUET SINGLE 34IN LL (TOURNIQUET CUFF) IMPLANT
CUFF TOURNIQUET SINGLE 44IN (TOURNIQUET CUFF) ×1 IMPLANT
DRAPE EXTREMITY T 121X128X90 (DRAPE) ×3 IMPLANT
DRAPE OEC MINIVIEW 54X84 (DRAPES) ×2 IMPLANT
DRAPE SURG 17X23 STRL (DRAPES) IMPLANT
DRAPE U-SHAPE 47X51 STRL (DRAPES) ×2 IMPLANT
DRSG MEPITEL 4X7.2 (GAUZE/BANDAGES/DRESSINGS) ×3 IMPLANT
DRSG PAD ABDOMINAL 8X10 ST (GAUZE/BANDAGES/DRESSINGS) ×3 IMPLANT
ELECT REM PT RETURN 9FT ADLT (ELECTROSURGICAL) ×3
ELECTRODE REM PT RTRN 9FT ADLT (ELECTROSURGICAL) ×1 IMPLANT
GAUZE SPONGE 4X4 12PLY STRL (GAUZE/BANDAGES/DRESSINGS) ×4 IMPLANT
GLOVE BIO SURGEON STRL SZ8 (GLOVE) ×3 IMPLANT
GLOVE BIOGEL PI IND STRL 7.0 (GLOVE) IMPLANT
GLOVE BIOGEL PI IND STRL 8 (GLOVE) ×2 IMPLANT
GLOVE BIOGEL PI INDICATOR 7.0 (GLOVE) ×4
GLOVE BIOGEL PI INDICATOR 8 (GLOVE) ×4
GLOVE ECLIPSE 6.5 STRL STRAW (GLOVE) ×2 IMPLANT
GLOVE ECLIPSE 8.0 STRL XLNG CF (GLOVE) ×3 IMPLANT
GOWN STRL REUS W/ TWL LRG LVL3 (GOWN DISPOSABLE) ×1 IMPLANT
GOWN STRL REUS W/ TWL XL LVL3 (GOWN DISPOSABLE) ×2 IMPLANT
GOWN STRL REUS W/TWL LRG LVL3 (GOWN DISPOSABLE) ×3
GOWN STRL REUS W/TWL XL LVL3 (GOWN DISPOSABLE) ×6
K-WIRE .054X4 (WIRE) ×2 IMPLANT
NDL HYPO 25X1 1.5 SAFETY (NEEDLE) IMPLANT
NEEDLE HYPO 22GX1.5 SAFETY (NEEDLE) IMPLANT
NEEDLE HYPO 25X1 1.5 SAFETY (NEEDLE) ×3 IMPLANT
NS IRRIG 1000ML POUR BTL (IV SOLUTION) ×3 IMPLANT
PACK BASIN DAY SURGERY FS (CUSTOM PROCEDURE TRAY) ×3 IMPLANT
PAD CAST 4YDX4 CTTN HI CHSV (CAST SUPPLIES) ×1 IMPLANT
PADDING CAST ABS 4INX4YD NS (CAST SUPPLIES)
PADDING CAST ABS COTTON 4X4 ST (CAST SUPPLIES) ×1 IMPLANT
PADDING CAST COTTON 4X4 STRL (CAST SUPPLIES) ×3
PENCIL BUTTON HOLSTER BLD 10FT (ELECTRODE) ×3 IMPLANT
SANITIZER HAND PURELL 535ML FO (MISCELLANEOUS) ×3 IMPLANT
SHEET MEDIUM DRAPE 40X70 STRL (DRAPES) ×3 IMPLANT
SLEEVE SCD COMPRESS KNEE MED (MISCELLANEOUS) ×3 IMPLANT
SPONGE LAP 18X18 X RAY DECT (DISPOSABLE) ×3 IMPLANT
STOCKINETTE 6  STRL (DRAPES) ×2
STOCKINETTE 6 STRL (DRAPES) ×1 IMPLANT
SUCTION FRAZIER HANDLE 10FR (MISCELLANEOUS) ×2
SUCTION TUBE FRAZIER 10FR DISP (MISCELLANEOUS) IMPLANT
SUT ETHILON 3 0 PS 1 (SUTURE) ×3 IMPLANT
SUT MNCRL AB 3-0 PS2 18 (SUTURE) ×3 IMPLANT
SUT MNCRL AB 4-0 PS2 18 (SUTURE) IMPLANT
SUT VIC AB 2-0 SH 27 (SUTURE)
SUT VIC AB 2-0 SH 27XBRD (SUTURE) IMPLANT
SYR BULB 3OZ (MISCELLANEOUS) ×3 IMPLANT
SYR CONTROL 10ML LL (SYRINGE) ×2 IMPLANT
TOWEL OR 17X24 6PK STRL BLUE (TOWEL DISPOSABLE) ×5 IMPLANT
TUBE CONNECTING 20'X1/4 (TUBING) ×1
TUBE CONNECTING 20X1/4 (TUBING) ×1 IMPLANT
UNDERPAD 30X30 (UNDERPADS AND DIAPERS) ×3 IMPLANT
YANKAUER SUCT BULB TIP NO VENT (SUCTIONS) IMPLANT

## 2016-09-15 NOTE — Discharge Instructions (Addendum)
Toni Arthurs, MD [facility]  Please read the following information regarding your care after surgery.  Medications  You only need a prescription for the narcotic pain medicine (ex. oxycodone, Percocet, Norco).  All of the other medicines listed below are available over the counter. X acetominophen (Tylenol) 650 mg every 4-6 hours as you need for minor pain X oxycodone as prescribed for moderate to severe pain   Narcotic pain medicine (ex. oxycodone, Percocet, Vicodin) will cause constipation.  To prevent this problem, take the following medicines while you are taking any pain medicine. X docusate sodium (Colace) 100 mg twice a day X senna (Senokot) 2 tablets twice a day    Weight Bearing X Bear weight only on the heel of your operated foot in the post-op shoe.   Dressing X Keep your splint or cast clean and dry.  Dont put anything (coat hanger, pencil, etc) down inside of it.  If it gets damp, use a hair dryer on the cool setting to dry it.  If it gets soaked, call the office to schedule an appointment for a dressing change.   After your dressing, cast or splint is removed; you may shower, but do not soak or scrub the wound.  Allow the water to run over it, and then gently pat it dry.  Swelling It is normal for you to have swelling where you had surgery.  To reduce swelling and pain, keep your toes above your nose for at least 3 days after surgery.  It may be necessary to keep your foot or leg elevated for several weeks.  If it hurts, it should be elevated.  Follow Up Call my office at [phone] when you are discharged from the hospital or surgery center to schedule an appointment to be seen two weeks after surgery.  Call my office at [phone] if you develop a fever >101.5 F, nausea, vomiting, bleeding from the surgical site or severe pain.        Post Anesthesia Home Care Instructions  Activity: Get plenty of rest for the remainder of the day. A  responsible individual must stay with you for 24 hours following the procedure.  For the next 24 hours, DO NOT: -Drive a car -Advertising copywriter -Drink alcoholic beverages -Take any medication unless instructed by your physician -Make any legal decisions or sign important papers.  Meals: Start with liquid foods such as gelatin or soup. Progress to regular foods as tolerated. Avoid greasy, spicy, heavy foods. If nausea and/or vomiting occur, drink only clear liquids until the nausea and/or vomiting subsides. Call your physician if vomiting continues.  Special Instructions/Symptoms: Your throat may feel dry or sore from the anesthesia or the breathing tube placed in your throat during surgery. If this causes discomfort, gargle with warm salt water. The discomfort should disappear within 24 hours.  If you had a scopolamine patch placed behind your ear for the management of post- operative nausea and/or vomiting:  1. The medication in the patch is effective for 72 hours, after which it should be removed.  Wrap patch in a tissue and discard in the trash. Wash hands thoroughly with soap and water. 2. You may remove the patch earlier than 72 hours if you experience unpleasant side effects which may include dry mouth, dizziness or visual disturbances. 3. Avoid touching the patch. Wash your hands with soap and water after contact with the patch.

## 2016-09-15 NOTE — Brief Op Note (Signed)
09/15/2016  10:54 AM  PATIENT:  Mataya Kilduff  41 y.o. female  PRE-OPERATIVE DIAGNOSIS:  Right Third metatarsal fracture nonunion   POST-OPERATIVE DIAGNOSIS:  Right Third metatarsal fracture nonunion   Procedure(s): 1.  Open treatment of right 3rd metatarsal nonunion with internal fixation   2.  AP and lateral xrays of the right foot   SURGEON:  Toni Arthurs, MD  ASSISTANT:  Alfredo Martinez, PA-C  ANESTHESIA:   General  EBL:  minimal   TOURNIQUET:   Total Tourniquet Time Documented: Calf (Right) - 22 minutes Total: Calf (Right) - 22 minutes   COMPLICATIONS:  None apparent  DISPOSITION:  Extubated, awake and stable to recovery.  DICTATION ID:  [id number]

## 2016-09-15 NOTE — Anesthesia Postprocedure Evaluation (Signed)
Anesthesia Post Note  Patient: Priscilla Jacobs  Procedure(s) Performed: Procedure(s) (LRB): Right Third Metatarsal Open Treatment of Nonunion and Exostectomy (Right)  Patient location during evaluation: PACU Anesthesia Type: General Level of consciousness: awake and alert Pain management: pain level controlled Vital Signs Assessment: post-procedure vital signs reviewed and stable Respiratory status: spontaneous breathing, nonlabored ventilation, respiratory function stable and patient connected to nasal cannula oxygen Cardiovascular status: blood pressure returned to baseline and stable Postop Assessment: no signs of nausea or vomiting Anesthetic complications: no       Last Vitals:  Vitals:   09/15/16 1145 09/15/16 1152  BP: 134/87   Pulse: 88 81  Resp: 16 (!) 21  Temp:      Last Pain:  Vitals:   09/15/16 1218  TempSrc:   PainSc: 5                  [name],JAMES TERRILL

## 2016-09-15 NOTE — Anesthesia Procedure Notes (Signed)
Procedure Name: LMA Insertion Date/Time: 09/15/2016 9:55 AM Performed by: Gladiola Madore D Pre-anesthesia Checklist: Patient identified, Emergency Drugs available, Suction available and Patient being monitored Patient Re-evaluated:Patient Re-evaluated prior to inductionOxygen Delivery Method: Circle system utilized Preoxygenation: Pre-oxygenation with 100% oxygen Intubation Type: IV induction Ventilation: Mask ventilation without difficulty LMA: LMA inserted LMA Size: 4.0 Number of attempts: 1 Airway Equipment and Method: Bite block Placement Confirmation: positive ETCO2 Tube secured with: Tape Dental Injury: Teeth and Oropharynx as per pre-operative assessment
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
## 2016-09-15 NOTE — Transfer of Care (Signed)
Immediate Anesthesia Transfer of Care Note  Patient: Priscilla Jacobs  Procedure(s) Performed: Procedure(s): Right Third Metatarsal Open Treatment of Nonunion and Exostectomy (Right)  Patient Location: PACU  Anesthesia Type:General  Level of Consciousness: awake and patient cooperative  Airway & Oxygen Therapy: Patient Spontanous Breathing and Patient connected to face mask oxygen  Post-op Assessment: Report given to RN and Post -op Vital signs reviewed and stable  Post vital signs: Reviewed and stable  Last Vitals:  Vitals:   09/15/16 0918  BP: 134/88  Resp: 20  Temp: 36.4 C    Last Pain:  Vitals:   09/15/16 0918  TempSrc: Oral  PainSc: 0-No pain         Complications: No apparent anesthesia complications

## 2016-09-15 NOTE — Anesthesia Preprocedure Evaluation (Addendum)
Anesthesia Evaluation  Patient identified by MRN, date of birth, ID band Patient awake    Reviewed: Allergy & Precautions, NPO status , Patient's Chart, lab work & pertinent test results  Airway Mallampati: II  TM Distance: <3 FB Neck ROM: Full    Dental no notable dental hx.    Pulmonary neg pulmonary ROS,    breath sounds clear to auscultation       Cardiovascular negative cardio ROS   Rhythm:Regular Rate:Normal     Neuro/Psych Seizures -,  PSYCHIATRIC DISORDERS negative psych ROS   GI/Hepatic negative GI ROS, Neg liver ROS,   Endo/Other  Morbid obesity  Renal/GU negative Renal ROS  negative genitourinary   Musculoskeletal negative musculoskeletal ROS (+)   Abdominal (+) + obese,   Peds negative pediatric ROS (+)  Hematology negative hematology ROS (+) anemia ,   Anesthesia Other Findings   Reproductive/Obstetrics negative OB ROS                           Anesthesia Physical Anesthesia Plan  ASA: II  Anesthesia Plan: General   Post-op Pain Management:  Regional for Post-op pain   Induction: Intravenous  Airway Management Planned: LMA  Additional Equipment:   Intra-op Plan:   Post-operative Plan: Extubation in OR  Informed Consent: I have reviewed the patients History and Physical, chart, labs and discussed the procedure including the risks, benefits and alternatives for the proposed anesthesia with the patient or authorized representative who has indicated his/her understanding and acceptance.     Plan Discussed with:   Anesthesia Plan Comments:         Anesthesia Quick Evaluation

## 2016-09-16 ENCOUNTER — Encounter (HOSPITAL_BASED_OUTPATIENT_CLINIC_OR_DEPARTMENT_OTHER): Payer: Self-pay | Admitting: Orthopedic Surgery

## 2017-02-22 ENCOUNTER — Other Ambulatory Visit: Payer: Self-pay | Admitting: Orthopedic Surgery

## 2017-02-22 DIAGNOSIS — S82462K Displaced segmental fracture of shaft of left fibula, subsequent encounter for closed fracture with nonunion: Secondary | ICD-10-CM

## 2017-02-24 ENCOUNTER — Other Ambulatory Visit: Payer: Self-pay

## 2017-02-28 ENCOUNTER — Other Ambulatory Visit: Payer: Self-pay

## 2017-03-03 ENCOUNTER — Other Ambulatory Visit: Payer: Self-pay

## 2017-03-10 ENCOUNTER — Ambulatory Visit
Admission: RE | Admit: 2017-03-10 | Discharge: 2017-03-10 | Disposition: A | Discharge status: Home / Self Care | Payer: Medicaid Other | Source: Ambulatory Visit | Attending: Orthopedic Surgery | Admitting: Orthopedic Surgery

## 2017-03-10 DIAGNOSIS — S82462K Displaced segmental fracture of shaft of left fibula, subsequent encounter for closed fracture with nonunion: Secondary | ICD-10-CM

## 2017-03-17 ENCOUNTER — Other Ambulatory Visit: Payer: Self-pay | Admitting: Orthopedic Surgery

## 2017-03-23 ENCOUNTER — Encounter (HOSPITAL_BASED_OUTPATIENT_CLINIC_OR_DEPARTMENT_OTHER): Payer: Self-pay | Admitting: *Deleted

## 2017-03-24 ENCOUNTER — Encounter (HOSPITAL_BASED_OUTPATIENT_CLINIC_OR_DEPARTMENT_OTHER): Payer: Self-pay | Admitting: *Deleted

## 2017-03-24 NOTE — Progress Notes (Signed)
[facility] Actual weight is 263 and BMI 51.36 which is above our BMI protocol. Will notify Dr Hewitt's office that surgery will be cancelled at our outpatient facility.

## 2017-03-29 ENCOUNTER — Encounter (HOSPITAL_COMMUNITY): Payer: Self-pay | Admitting: *Deleted

## 2017-03-29 MED ORDER — DEXTROSE 5 % IV SOLN
3.0000 g | INTRAVENOUS | Status: AC
  Administered 2017-03-30: 3 g via INTRAVENOUS
  Filled 2017-03-29: qty 3000

## 2017-03-29 MED ORDER — SODIUM CHLORIDE 0.9 % IV SOLN: INTRAVENOUS | Status: DC

## 2017-03-29 NOTE — Progress Notes (Signed)
Spoke with pt for pre-op call. Pt denies cardiac history, chest pain or sob. Denies being diabetic, but has low blood sugar.

## 2017-03-29 NOTE — Anesthesia Preprocedure Evaluation (Addendum)
Anesthesia Evaluation  Patient identified by MRN, date of birth, ID band Patient awake    Reviewed: Allergy & Precautions, NPO status , Patient's Chart, lab work & pertinent test results  Airway Mallampati: II  TM Distance: >3 FB Neck ROM: Full    Dental  (+) Teeth Intact   Pulmonary sleep apnea ,    breath sounds clear to auscultation       Cardiovascular negative cardio ROS   Rhythm:Regular Rate:Normal     Neuro/Psych negative neurological ROS     GI/Hepatic Neg liver ROS, GERD  Medicated,  Endo/Other  Morbid obesity  Renal/GU negative Renal ROS     Musculoskeletal  (+) Arthritis ,   Abdominal   Peds  Hematology negative hematology ROS (+)   Anesthesia Other Findings   Reproductive/Obstetrics                           Lab Results  Component Value Date   WBC 6.6 02/23/2016   HGB 10.4 (L) 02/23/2016   HCT 33.6 (L) 02/23/2016   MCV 90.1 02/23/2016   PLT 355 02/23/2016   Lab Results  Component Value Date   CREATININE 0.71 02/29/2016   BUN 7 02/23/2016   NA 135 02/23/2016   K 4.2 02/23/2016   CL 98 (L) 02/23/2016   CO2 29 02/23/2016    Anesthesia Physical Anesthesia Plan  ASA: III  Anesthesia Plan: General   Post-op Pain Management:  Regional for Post-op pain   Induction: Intravenous  PONV Risk Score and Plan: 3 and Ondansetron, Dexamethasone, Midazolam and Treatment may vary due to age or medical condition  Airway Management Planned: Oral ETT  Additional Equipment:   Intra-op Plan:   Post-operative Plan: Extubation in OR  Informed Consent: I have reviewed the patients History and Physical, chart, labs and discussed the procedure including the risks, benefits and alternatives for the proposed anesthesia with the patient or authorized representative who has indicated his/her understanding and acceptance.   Dental advisory given  Plan Discussed with:  CRNA  Anesthesia Plan Comments:        Anesthesia Quick Evaluation

## 2017-03-30 ENCOUNTER — Ambulatory Visit (HOSPITAL_COMMUNITY): Payer: Medicaid Other | Admitting: Anesthesiology

## 2017-03-30 ENCOUNTER — Ambulatory Visit (HOSPITAL_COMMUNITY)
Admission: RE | Admit: 2017-03-30 | Discharge: 2017-03-30 | Disposition: A | Discharge status: Home / Self Care | Payer: Medicaid Other | Source: Ambulatory Visit | Attending: Orthopedic Surgery | Admitting: Orthopedic Surgery

## 2017-03-30 ENCOUNTER — Encounter (HOSPITAL_COMMUNITY)
Admission: RE | Disposition: A | Discharge status: Home / Self Care | Payer: Self-pay | Source: Ambulatory Visit | Attending: Orthopedic Surgery

## 2017-03-30 ENCOUNTER — Encounter (HOSPITAL_COMMUNITY): Payer: Self-pay

## 2017-03-30 DIAGNOSIS — Z79899 Other long term (current) drug therapy: Secondary | ICD-10-CM | POA: Insufficient documentation

## 2017-03-30 DIAGNOSIS — G4730 Sleep apnea, unspecified: Secondary | ICD-10-CM | POA: Diagnosis not present

## 2017-03-30 DIAGNOSIS — Z9889 Other specified postprocedural states: Secondary | ICD-10-CM

## 2017-03-30 DIAGNOSIS — K219 Gastro-esophageal reflux disease without esophagitis: Secondary | ICD-10-CM | POA: Diagnosis not present

## 2017-03-30 DIAGNOSIS — M899 Disorder of bone, unspecified: Secondary | ICD-10-CM | POA: Diagnosis present

## 2017-03-30 DIAGNOSIS — M898X7 Other specified disorders of bone, ankle and foot: Secondary | ICD-10-CM | POA: Insufficient documentation

## 2017-03-30 DIAGNOSIS — Z6841 Body Mass Index (BMI) 40.0 and over, adult: Secondary | ICD-10-CM | POA: Insufficient documentation

## 2017-03-30 HISTORY — DX: Unspecified osteoarthritis, unspecified site: M19.90

## 2017-03-30 HISTORY — DX: Disease of blood and blood-forming organs, unspecified: D75.9

## 2017-03-30 HISTORY — DX: Gastro-esophageal reflux disease without esophagitis: K21.9

## 2017-03-30 HISTORY — DX: Sleep apnea, unspecified: G47.30

## 2017-03-30 HISTORY — DX: Hypoglycemia, unspecified: E16.2

## 2017-03-30 HISTORY — PX: OSTECTOMY: SHX6439

## 2017-03-30 LAB — CBC
HCT: 43.7 % (ref 36.0–46.0)
Hemoglobin: 14.4 g/dL (ref 12.0–15.0)
MCH: 28.4 pg (ref 26.0–34.0)
MCHC: 33 g/dL (ref 30.0–36.0)
MCV: 86.2 fL (ref 78.0–100.0)
PLATELETS: 320 10*3/uL (ref 150–400)
RBC: 5.07 MIL/uL (ref 3.87–5.11)
RDW: 14.1 % (ref 11.5–15.5)
WBC: 7.1 10*3/uL (ref 4.0–10.5)

## 2017-03-30 SURGERY — OSTECTOMY
Anesthesia: General | Site: Foot | Laterality: Right

## 2017-03-30 MED ORDER — FENTANYL CITRATE (PF) 250 MCG/5ML IJ SOLN
INTRAMUSCULAR | Status: AC
Start: 2017-03-30 — End: ?
  Filled 2017-03-30: qty 5

## 2017-03-30 MED ORDER — BUPIVACAINE-EPINEPHRINE (PF) 0.5% -1:200000 IJ SOLN
INTRAMUSCULAR | Status: DC | PRN
  Administered 2017-03-30: 25 mL via PERINEURAL

## 2017-03-30 MED ORDER — PROPOFOL 10 MG/ML IV BOLUS
INTRAVENOUS | Status: AC
  Filled 2017-03-30: qty 20

## 2017-03-30 MED ORDER — PROPOFOL 10 MG/ML IV BOLUS
INTRAVENOUS | Status: DC | PRN
  Administered 2017-03-30: 200 mg via INTRAVENOUS

## 2017-03-30 MED ORDER — MIDAZOLAM HCL 5 MG/5ML IJ SOLN
INTRAMUSCULAR | Status: DC | PRN
  Administered 2017-03-30: 2 mg via INTRAVENOUS

## 2017-03-30 MED ORDER — DEXAMETHASONE SODIUM PHOSPHATE 10 MG/ML IJ SOLN
INTRAMUSCULAR | Status: AC
  Filled 2017-03-30: qty 1

## 2017-03-30 MED ORDER — LIDOCAINE HCL (CARDIAC) 20 MG/ML IV SOLN
INTRAVENOUS | Status: DC | PRN
  Administered 2017-03-30: 40 mg via INTRAVENOUS

## 2017-03-30 MED ORDER — ASPIRIN EC 81 MG PO TBEC: 81.0000 mg | DELAYED_RELEASE_TABLET | Freq: Two times a day (BID) | ORAL | 0 refills | Status: AC

## 2017-03-30 MED ORDER — ONDANSETRON HCL 4 MG/2ML IJ SOLN
INTRAMUSCULAR | Status: DC | PRN
  Administered 2017-03-30: 4 mg via INTRAVENOUS

## 2017-03-30 MED ORDER — ONDANSETRON HCL 4 MG/2ML IJ SOLN
INTRAMUSCULAR | Status: AC
  Filled 2017-03-30: qty 2

## 2017-03-30 MED ORDER — DOCUSATE SODIUM 100 MG PO CAPS: 100.0000 mg | ORAL_CAPSULE | Freq: Two times a day (BID) | ORAL | 0 refills | Status: AC

## 2017-03-30 MED ORDER — FENTANYL CITRATE (PF) 100 MCG/2ML IJ SOLN
INTRAMUSCULAR | Status: DC | PRN
  Administered 2017-03-30 (×2): 50 ug via INTRAVENOUS

## 2017-03-30 MED ORDER — VANCOMYCIN HCL 1000 MG IV SOLR
INTRAVENOUS | Status: AC
  Filled 2017-03-30: qty 1000

## 2017-03-30 MED ORDER — LACTATED RINGERS IV SOLN
INTRAVENOUS | Status: DC | PRN
  Administered 2017-03-30: 07:00:00 via INTRAVENOUS

## 2017-03-30 MED ORDER — HYDROMORPHONE HCL 1 MG/ML IJ SOLN: 0.2500 mg | INTRAMUSCULAR | Status: DC | PRN

## 2017-03-30 MED ORDER — MIDAZOLAM HCL 2 MG/2ML IJ SOLN
INTRAMUSCULAR | Status: AC
  Filled 2017-03-30: qty 2

## 2017-03-30 MED ORDER — 0.9 % SODIUM CHLORIDE (POUR BTL) OPTIME
TOPICAL | Status: DC | PRN
  Administered 2017-03-30: 1000 mL

## 2017-03-30 MED ORDER — CHLORHEXIDINE GLUCONATE 4 % EX LIQD: 60.0000 mL | Freq: Once | CUTANEOUS | Status: DC

## 2017-03-30 MED ORDER — SENNA 8.6 MG PO TABS: 2.0000 | ORAL_TABLET | Freq: Two times a day (BID) | ORAL | 0 refills | Status: AC

## 2017-03-30 MED ORDER — PROMETHAZINE HCL 25 MG/ML IJ SOLN: 6.2500 mg | INTRAMUSCULAR | Status: DC | PRN

## 2017-03-30 MED ORDER — DEXAMETHASONE SODIUM PHOSPHATE 10 MG/ML IJ SOLN
INTRAMUSCULAR | Status: DC | PRN
  Administered 2017-03-30: 10 mg via INTRAVENOUS

## 2017-03-30 SURGICAL SUPPLY — 59 items
BANDAGE ESMARK 6X9 LF (GAUZE/BANDAGES/DRESSINGS) ×1 IMPLANT
BIT DRILL 5/64X5 DISP (BIT) ×2 IMPLANT
BIT DRILL 7/64X5 DISP (BIT) ×2 IMPLANT
BLADE MICRO SAGITTAL (BLADE) ×1 IMPLANT
BLADE SAW SGTL 13X75X1.27 (BLADE) ×3 IMPLANT
BLADE SURG 10 STRL SS (BLADE) ×3 IMPLANT
BNDG CMPR 9X6 STRL LF SNTH (GAUZE/BANDAGES/DRESSINGS)
BNDG COHESIVE 4X5 TAN STRL (GAUZE/BANDAGES/DRESSINGS) ×3 IMPLANT
BNDG COHESIVE 6X5 TAN STRL LF (GAUZE/BANDAGES/DRESSINGS) ×1 IMPLANT
BNDG ESMARK 6X9 LF (GAUZE/BANDAGES/DRESSINGS)
CANISTER SUCT 3000ML PPV (MISCELLANEOUS) ×3 IMPLANT
CHLORAPREP W/TINT 26ML (MISCELLANEOUS) ×3 IMPLANT
COVER SURGICAL LIGHT HANDLE (MISCELLANEOUS) ×3 IMPLANT
CUFF TOURNIQUET SINGLE 34IN LL (TOURNIQUET CUFF) ×3 IMPLANT
CUFF TOURNIQUET SINGLE 44IN (TOURNIQUET CUFF) IMPLANT
DRAPE C-ARM 42X72 X-RAY (DRAPES) IMPLANT
DRAPE C-ARM MINI 42X72 WSTRAPS (DRAPES) ×3 IMPLANT
DRAPE U-SHAPE 47X51 STRL (DRAPES) IMPLANT
DRSG ADAPTIC 3X8 NADH LF (GAUZE/BANDAGES/DRESSINGS) IMPLANT
DRSG MEPITEL 4X7.2 (GAUZE/BANDAGES/DRESSINGS) ×3 IMPLANT
DRSG PAD ABDOMINAL 8X10 ST (GAUZE/BANDAGES/DRESSINGS) ×6 IMPLANT
ELECT REM PT RETURN 9FT ADLT (ELECTROSURGICAL) ×3
ELECTRODE REM PT RTRN 9FT ADLT (ELECTROSURGICAL) ×1 IMPLANT
GAUZE SPONGE 4X4 12PLY STRL (GAUZE/BANDAGES/DRESSINGS) IMPLANT
GAUZE SPONGE 4X4 12PLY STRL LF (GAUZE/BANDAGES/DRESSINGS) ×2 IMPLANT
GLOVE BIO SURGEON STRL SZ8 (GLOVE) ×3 IMPLANT
GLOVE BIOGEL PI IND STRL 8 (GLOVE) ×2 IMPLANT
GLOVE BIOGEL PI INDICATOR 8 (GLOVE) ×4
GLOVE ECLIPSE 8.0 STRL XLNG CF (GLOVE) ×3 IMPLANT
GOWN STRL REUS W/ TWL LRG LVL3 (GOWN DISPOSABLE) IMPLANT
GOWN STRL REUS W/ TWL XL LVL3 (GOWN DISPOSABLE) ×2 IMPLANT
GOWN STRL REUS W/TWL LRG LVL3 (GOWN DISPOSABLE)
GOWN STRL REUS W/TWL XL LVL3 (GOWN DISPOSABLE) ×9
HOVERMATT SINGLE USE (MISCELLANEOUS) ×2 IMPLANT
KIT BASIN OR (CUSTOM PROCEDURE TRAY) ×3 IMPLANT
KIT ROOM TURNOVER OR (KITS) ×3 IMPLANT
NEEDLE 22X1 1/2 (OR ONLY) (NEEDLE) IMPLANT
NS IRRIG 1000ML POUR BTL (IV SOLUTION) ×3 IMPLANT
PACK ORTHO EXTREMITY (CUSTOM PROCEDURE TRAY) ×3 IMPLANT
PAD ABD 8X10 STRL (GAUZE/BANDAGES/DRESSINGS) ×2 IMPLANT
PAD ARMBOARD 7.5X6 YLW CONV (MISCELLANEOUS) ×6 IMPLANT
PAD CAST 4YDX4 CTTN HI CHSV (CAST SUPPLIES) ×1 IMPLANT
PADDING CAST COTTON 4X4 STRL (CAST SUPPLIES) ×3
SOAP 2 % CHG 4 OZ (WOUND CARE) ×1 IMPLANT
SPONGE SURGIFOAM ABS GEL 12-7 (HEMOSTASIS) ×2 IMPLANT
STAPLER VISISTAT 35W (STAPLE) IMPLANT
SUCTION FRAZIER HANDLE 10FR (MISCELLANEOUS) ×2
SUCTION TUBE FRAZIER 10FR DISP (MISCELLANEOUS) ×1 IMPLANT
SUT ETHILON 3 0 FSL (SUTURE) ×1 IMPLANT
SUT MNCRL AB 3-0 PS2 18 (SUTURE) ×5 IMPLANT
SUT PROLENE 3 0 PS 2 (SUTURE) ×2 IMPLANT
SUT VIC AB 2-0 CT1 27 (SUTURE) ×6
SUT VIC AB 2-0 CT1 TAPERPNT 27 (SUTURE) ×1 IMPLANT
SYR CONTROL 10ML LL (SYRINGE) IMPLANT
TOWEL OR 17X24 6PK STRL BLUE (TOWEL DISPOSABLE) ×3 IMPLANT
TOWEL OR 17X26 10 PK STRL BLUE (TOWEL DISPOSABLE) ×3 IMPLANT
TUBE CONNECTING 12'X1/4 (SUCTIONS) ×1
TUBE CONNECTING 12X1/4 (SUCTIONS) ×2 IMPLANT
WATER STERILE IRR 1000ML POUR (IV SOLUTION) ×3 IMPLANT
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
## 2017-03-30 NOTE — Anesthesia Procedure Notes (Signed)
Procedure Name: Intubation Date/Time: 03/30/2017 7:38 AM Performed by: Kyung Rudd Pre-anesthesia Checklist: Patient identified, Emergency Drugs available, Suction available and Patient being monitored Patient Re-evaluated:Patient Re-evaluated prior to induction Oxygen Delivery Method: Circle system utilized Preoxygenation: Pre-oxygenation with 100% oxygen Induction Type: IV induction, Rapid sequence and Cricoid Pressure applied Laryngoscope Size: Mac and 3 Grade View: Grade I Tube type: Oral Tube size: 7.0 mm Number of attempts: 1 Airway Equipment and Method: Stylet Placement Confirmation: ETT inserted through vocal cords under direct vision,  positive ETCO2 and breath sounds checked- equal and bilateral Secured at: 21 cm Tube secured with: Tape Dental Injury: Teeth and Oropharynx as per pre-operative assessment

## 2017-03-30 NOTE — Progress Notes (Signed)
Report given to erika rn as caregiver 

## 2017-03-30 NOTE — Anesthesia Procedure Notes (Addendum)
Anesthesia Regional Block: Popliteal block   Pre-Anesthetic Checklist: ,, timeout performed, Correct Patient, Correct Site, Correct Laterality, Correct Procedure, Correct Position, site marked, Risks and benefits discussed,  Surgical consent,  Pre-op evaluation,  At surgeon's request and post-op pain management  Laterality: Right  Prep: chloraprep       Needles:  Injection technique: Single-shot  Needle Type: Echogenic Needle     Needle Length: 9cm  Needle Gauge: 21     Additional Needles:   Procedures:,,,, ultrasound used (permanent image in chart),,,,  Narrative:  Start time: 03/30/2017 7:06 AM End time: 03/30/2017 7:13 AM Injection made incrementally with aspirations every 5 mL.  Performed by: Personally  Anesthesiologist: Marcene DuosFITZGERALD, Sofiah Lyne

## 2017-03-30 NOTE — Anesthesia Postprocedure Evaluation (Signed)
Anesthesia Post Note  Patient: Priscilla Jacobs  Procedure(s) Performed: Right foot plantar exostectomy (Right Foot)     Patient location during evaluation: PACU Anesthesia Type: General Level of consciousness: awake and alert Pain management: pain level controlled Vital Signs Assessment: post-procedure vital signs reviewed and stable Respiratory status: spontaneous breathing, nonlabored ventilation, respiratory function stable and patient connected to nasal cannula oxygen Cardiovascular status: blood pressure returned to baseline and stable Postop Assessment: no apparent nausea or vomiting Anesthetic complications: no    Last Vitals:  Vitals:   03/30/17 0845 03/30/17 0900  BP: 123/77 (!) 125/59  Pulse: 69 69  Resp: 16 16  Temp:    SpO2: 96% 96%    Last Pain:  Vitals:   03/30/17 0900  TempSrc:   PainSc: 0-No pain                 Kennieth RadFitzgerald, Alonna Bartling E

## 2017-03-30 NOTE — Transfer of Care (Signed)
Immediate Anesthesia Transfer of Care Note  Patient: Priscilla Jacobs  Procedure(s) Performed: Right foot plantar exostectomy (Right Foot)  Patient Location: PACU  Anesthesia Type:General  Level of Consciousness: awake, alert  and oriented  Airway & Oxygen Therapy: Patient Spontanous Breathing and Patient connected to nasal cannula oxygen  Post-op Assessment: Report given to RN and Post -op Vital signs reviewed and stable  Post vital signs: Reviewed and stable  Last Vitals:  Vitals:   03/30/17 0609  BP: (!) 153/85  Pulse: 84  Resp: 20  Temp: 36.6 C  SpO2: 93%    Last Pain:  Vitals:   03/30/17 0609  TempSrc: Oral      Patients Stated Pain Goal: 0 (03/30/17 0620)  Complications: No apparent anesthesia complications

## 2017-03-30 NOTE — Progress Notes (Signed)
Orthopedic Tech Progress Note Patient Details:  Priscilla MaduroSusan Jacobs 1975/10/17 [id number]  Ortho Devices Type of Ortho Device: Postop shoe/boot Ortho Device/Splint Interventions: Application   Saul FordyceJennifer C Baylin Gamblin 03/30/2017, 10:34 AM

## 2017-03-30 NOTE — Discharge Instructions (Addendum)
Toni ArthursJohn Hewitt, MD [facility]: [facility]  Please read the following information regarding your care after surgery.  Medications  You only need a prescription for the narcotic pain medicine (ex. oxycodone, Percocet, Norco).  All of the other medicines listed below are available over the counter. X Aleve 2 pills twice a day for the first 3 days after surgery. X acetominophen (Tylenol) 650 mg every 4-6 hours as you need for minor to moderate pain X hydrocodone as prescribed for severe pain  Narcotic pain medicine (ex. oxycodone, Percocet, Vicodin) will cause constipation.  To prevent this problem, take the following medicines while you are taking any pain medicine. X docusate sodium (Colace) 100 mg twice a day X senna (Senokot) 2 tablets twice a day  X To help prevent blood clots, take a baby aspirin (81 mg) twice a day for two weeks after surgery.  You should also get up every hour while you are awake to move around.    Weight Bearing X Bear weight only on your operated foot in the post-op shoe.  Cast / Splint / Dressing X Keep your splint, cast or dressing clean and dry.  Dont put anything (coat hanger, pencil, etc) down inside of it.  If it gets damp, use a hair dryer on the cool setting to dry it.  If it gets soaked, call the office to schedule an appointment for a cast change.   After your dressing, cast or splint is removed; you may shower, but do not soak or scrub the wound.  Allow the water to run over it, and then gently pat it dry.  Swelling It is normal for you to have swelling where you had surgery.  To reduce swelling and pain, keep your toes above your nose for at least 3 days after surgery.  It may be necessary to keep your foot or leg elevated for several weeks.  If it hurts, it should be elevated.  Follow Up Call my office at [phone] when you are discharged from the hospital or surgery center to schedule an appointment to be seen two weeks after surgery.  Call my  office at [phone] if you develop a fever >101.5 F, nausea, vomiting, bleeding from the surgical site or severe pain.

## 2017-03-30 NOTE — H&P (Signed)
Priscilla Jacobs is an 41 y.o. female.   Chief Complaint:  Right foot pain HPI:  41 y/o female with right plantar foot pain from a malunion of the 3rd MT fracture.  She has a painful exostosis of the 3rd MT and presents now for surgical removal of this painful bony prominence.    Past Medical History:  Diagnosis Date  . Arthritis   . Blood dyscrasia    during pregnancy  . 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 GERD (gastroesophageal reflux disease)   . History of concussion 02/2016  . History of MRSA infection 2008   arm  . History of seizures    x 2 - undetermined cause; pt. states she thinks cause was low blood sugar; has never been on anticonvulsants  . Low blood sugar   . Meniere's disease   . Obesity   . Sleep apnea    uses CPAP    Past Surgical History:  Procedure Laterality Date  . ANKLE CLOSED REDUCTION Bilateral 02/13/2016   Procedure: IRRIGATION AND DEBRIDEMENT RIGHT FOOT WITH CLOSED REDUCTION BILATERAL LOWER EXTREMITIES;  Surgeon: Durene Romans, MD;  Location: [facility] OR;  Service: Orthopedics;  Laterality: Bilateral;  . ANKLE RECONSTRUCTION Right 02/16/2016   Procedure: I&D RIGHT FOOT/CLOSE REDUCTION METATARSAL FX/LIGAMENT REPAIR/ CLOSED REDUCTION LEFT PILON FRACTURE;  Surgeon: Toni Arthurs, MD;  Location: [facility] OR;  Service: Orthopedics;  Laterality: Right;  . CERVICAL CONIZATION W/BX  12/29/2010   Procedure: CONIZATION CERVIX WITH BIOPSY;  Surgeon: Hollie Salk C. Marice Potter, MD;  Location: [facility];  Service: Gynecology;  Laterality: N/A;  . EXTERNAL FIXATION LEG Left 02/16/2016   Procedure: EXTERNAL FIXATION LEG, left ankle;  Surgeon: Toni Arthurs, MD;  Location: [facility] OR;  Service: Orthopedics;  Laterality: Left;  . EXTERNAL FIXATION REMOVAL Left 02/20/2016   Procedure: REMOVAL EXTERNAL FIXATION LEG;  Surgeon: Toni Arthurs, MD;  Location: [facility] OR;  Service: Orthopedics;  Laterality: Left;  . I&D EXTREMITY Right 02/13/2016   Procedure: IRRIGATION AND DEBRIDEMENT EXTREMITY;   Surgeon: Durene Romans, MD;  Location: [facility] OR;  Service: Orthopedics;  Laterality: Right;  . METATARSAL HEAD EXCISION Right 09/15/2016   Procedure: Right Third Metatarsal Open Treatment of Nonunion and Exostectomy;  Surgeon: Toni Arthurs, MD;  Location: [facility];  Service: Orthopedics;  Laterality: Right;  . OPEN REDUCTION INTERNAL FIXATION (ORIF) TIBIA/FIBULA FRACTURE Left 02/20/2016   Procedure: OPEN REDUCTION INTERNAL FIXATION (ORIF) TIBIA/FIBULA FRACTURE;  Surgeon: Toni Arthurs, MD;  Location: [facility] OR;  Service: Orthopedics;  Laterality: Left;  . WISDOM TOOTH EXTRACTION      Family History  Problem Relation Age of Onset  . Hiatal hernia Mother   . Osteoporosis Mother   . Bell's palsy Mother   . Hypertension Father   . Diabetes Father   . Sleep apnea Father    Social History:  reports that she has never smoked. She has never used smokeless tobacco. She reports that she does not drink alcohol or use drugs.  Allergies: No Known Allergies  Medications Prior to Admission  Medication Sig Dispense Refill  . acetaminophen (TYLENOL) 650 MG CR tablet Take 1,300 mg by mouth at bedtime.    . meclizine (ANTIVERT) 25 MG tablet Take 12.5 mg by mouth 2 (two) times daily.     . Multiple Vitamin (MULTIVITAMIN) tablet Take 1 tablet by mouth daily.    Marland Kitchen omeprazole (PRILOSEC) 40 MG capsule Take 40 mg by mouth daily.    . ranitidine (ZANTAC) 150 MG  tablet Take 150 mg by mouth at bedtime.       Results for orders placed or performed during the hospital encounter of 03/30/17 (from the past 48 hour(s))  CBC     Status: None   Collection Time: 03/30/17  6:14 AM  Result Value Ref Range   WBC 7.1 4.0 - 10.5 K/uL   RBC 5.07 3.87 - 5.11 MIL/uL   Hemoglobin 14.4 12.0 - 15.0 g/dL   HCT 14.743.7 82.936.0 - 56.246.0 %   MCV 86.2 78.0 - 100.0 fL   MCH 28.4 26.0 - 34.0 pg   MCHC 33.0 30.0 - 36.0 g/dL   RDW 13.014.1 86.511.5 - 78.415.5 %   Platelets 320 150 - 400 K/uL   No results found.  ROS  No recent f/c/n/v/wt  loss  Blood pressure (!) 153/85, pulse 84, temperature 97.9 F (36.6 C), temperature source Oral, resp. rate 20, height 5' (1.524 m), weight 119.3 kg (263 lb), last menstrual period 11/19/2010, SpO2 93 %. Physical Exam  wn wd woman in nad.  A and o x 4.  Mood and affect normal.  EOMI.  resp unlabored.  R foot with healthy skin.  TTP at 3rd MT head.  Skin o/w healthy.  No lymphadenopathy.  5/5 strength in PF and DF of the ankle and toes.  Sens to LT intact at the plantar forefoot.   Assessment/Plan R foot plantar exostosis - to OR for excision of the bony prominence.  The risks and benefits of the alternative treatment options have been discussed in detail.  The patient wishes to proceed with surgery and specifically understands risks of bleeding, infection, nerve damage, blood clots, need for additional surgery, amputation and death.   Toni ArthursHEWITT, Ksean Vale, MD 03/30/2017, 7:21 AM

## 2017-03-31 ENCOUNTER — Encounter (HOSPITAL_COMMUNITY): Payer: Self-pay | Admitting: Orthopedic Surgery

## 2018-01-17 IMAGING — CT CT TIBIA FIBULA *L* W/O CM
1 of 3 series · 6 of 14 positions shown, 8 images · non-contrast
Comparison: None.

CLINICAL DATA: Left tibia fibula fracture, persistent ankle
swelling. Evaluate for nonunion.

EXAM:
CT OF THE LOWER LEFT EXTREMITY WITHOUT CONTRAST
TECHNIQUE: Multidetector CT imaging of the lower left extremity was performed
according to the standard protocol.

[Series 102: tib-fib soft/left · axial · 0.39mm/px · z∈[-410,-135]mm · 6 of 155 slices shown, 8 images]
[im 23/155  soft-tissue]
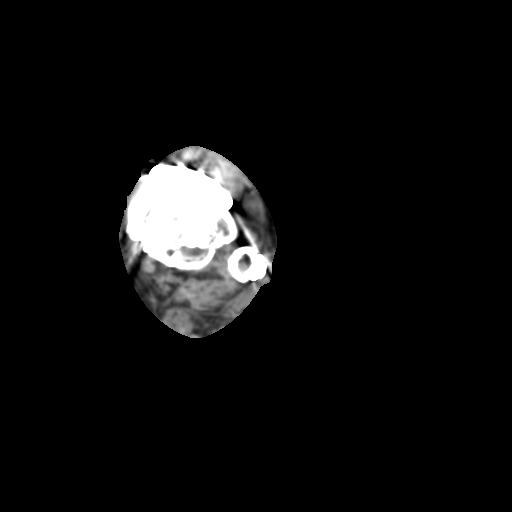
[im 23/155  bone]
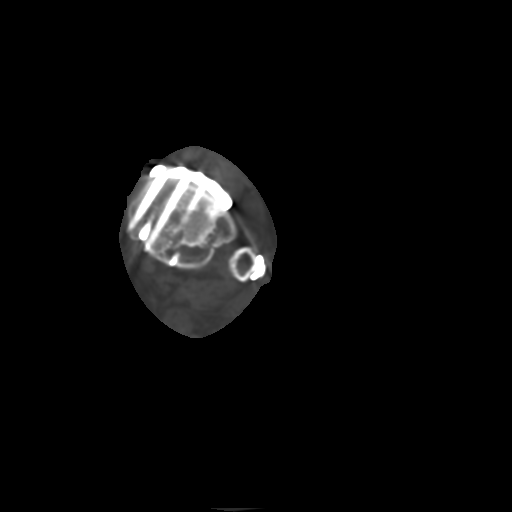
[im 45/155  bone]
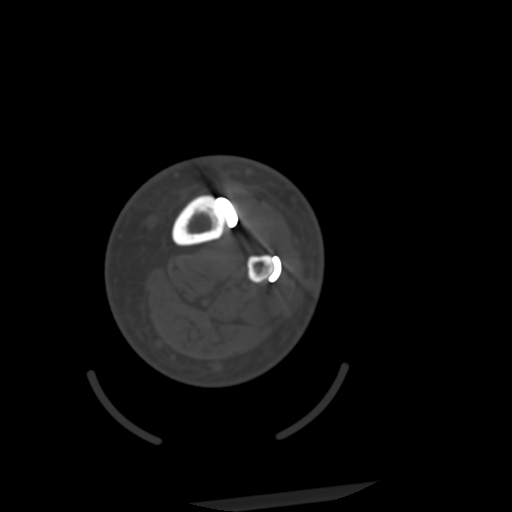
[im 67/155  bone]
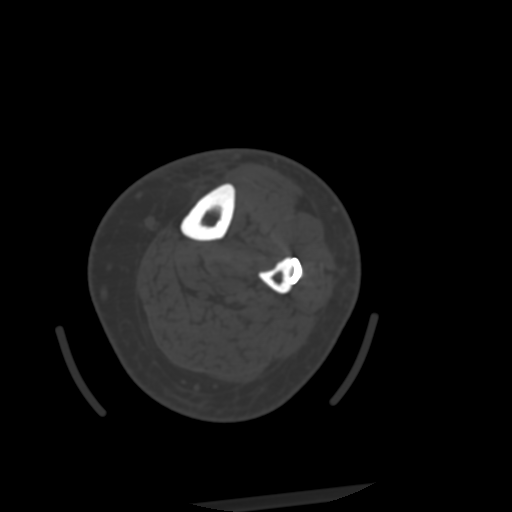
[im 89/155  bone]
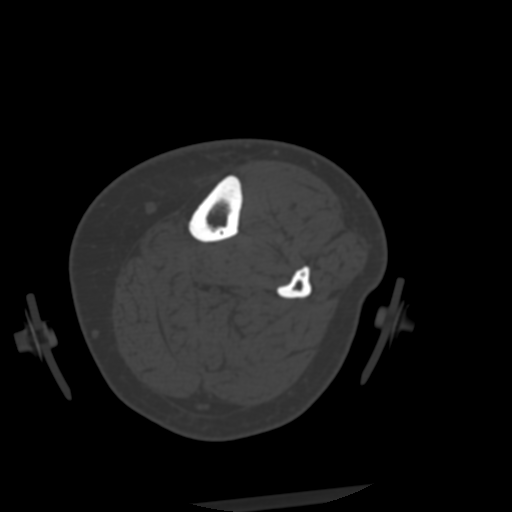
[im 111/155  soft-tissue]
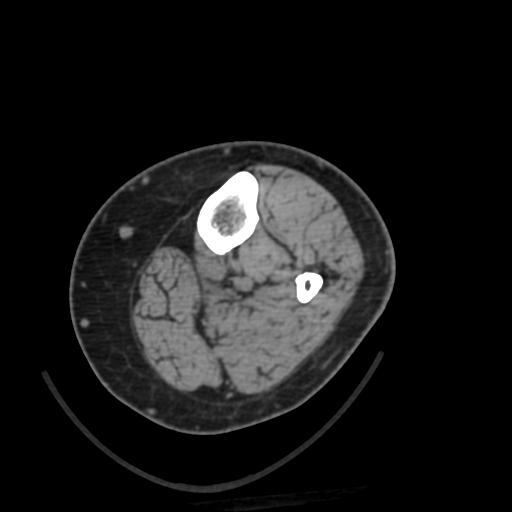
[im 111/155  bone]
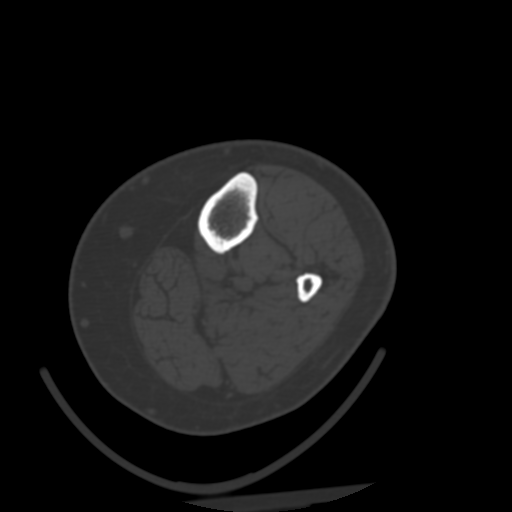
[im 133/155  bone]
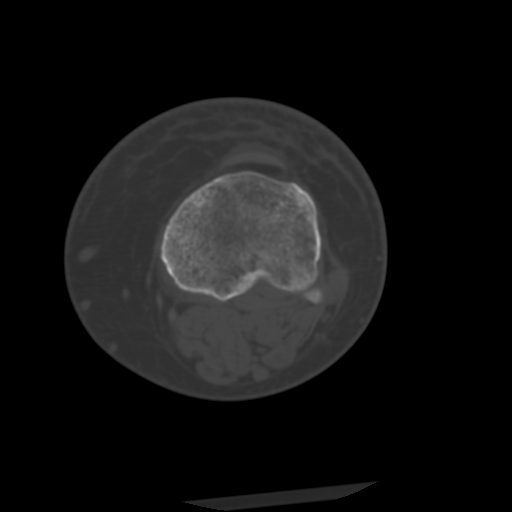

[6 of 14 positions shown; findings below may reference images not displayed]

FINDINGS: Bones/Joint/Cartilage

Postsurgical changes related to prior anterior plate and screw
fixation of a distal tibia metaphyseal fracture, which appears
healed. There is no evidence of hardware loosening or failure. Prior
lateral plate and screw fixation of a distal fibular diaphyseal
fracture, which also appears healed. There is no evidence of
hardware failure or loosening.

The talar dome is intact. The ankle mortise is symmetric. There are
mild degenerative changes of the tibiotalar joint with joint space
narrowing and subchondral sclerosis. Small tibiotalar joint
effusion. Prominent os trigonum.

No new fracture. Several ghost tracks are noted in the proximal
tibia. The visualized bones in the foot are osteopenic. Mild medial
compartment degenerative changes of the knee. No knee joint
effusion.

Ligaments

Suboptimally evaluated by CT.

Muscles and Tendons

No focal abnormality.  No significant muscle atrophy.

Soft tissues

Mild soft tissue swelling about the ankle.
IMPRESSION: 1. Postsurgical changes related to prior ORIF of a distal tibial
metaphyseal fracture and a distal fibular diaphyseal fracture, which
appear essentially healed. No evidence of nonunion. No hardware
complication.
2. Mild tibiotalar joint degenerative changes with associated small
effusion.
